# Patient Record
Sex: Female | Born: 1950 | Race: White | Hispanic: No | Marital: Married | Smoking: Never smoker
Health system: Southern US, Community
[De-identification: ages and names within clinical notes are randomized; demographics above are authoritative.]

## PROBLEM LIST (undated history)

## (undated) DIAGNOSIS — K760 Fatty (change of) liver, not elsewhere classified: Secondary | ICD-10-CM

## (undated) DIAGNOSIS — E782 Mixed hyperlipidemia: Secondary | ICD-10-CM

## (undated) DIAGNOSIS — I10 Essential (primary) hypertension: Secondary | ICD-10-CM

## (undated) DIAGNOSIS — D649 Anemia, unspecified: Secondary | ICD-10-CM

## (undated) DIAGNOSIS — L719 Rosacea, unspecified: Secondary | ICD-10-CM

## (undated) DIAGNOSIS — K449 Diaphragmatic hernia without obstruction or gangrene: Secondary | ICD-10-CM

## (undated) DIAGNOSIS — K219 Gastro-esophageal reflux disease without esophagitis: Secondary | ICD-10-CM

## (undated) HISTORY — DX: Fatty (change of) liver, not elsewhere classified: K76.0

## (undated) HISTORY — DX: Anemia, unspecified: D64.9

## (undated) HISTORY — DX: Diaphragmatic hernia without obstruction or gangrene: K44.9

## (undated) HISTORY — PX: CARPAL TUNNEL RELEASE: SHX101

## (undated) HISTORY — DX: Gastro-esophageal reflux disease without esophagitis: K21.9

## (undated) HISTORY — DX: Rosacea, unspecified: L71.9

## (undated) HISTORY — DX: Mixed hyperlipidemia: E78.2

## (undated) HISTORY — DX: Essential (primary) hypertension: I10

---

## 1974-03-14 HISTORY — PX: TUBAL LIGATION: SHX77

## 1998-07-10 ENCOUNTER — Other Ambulatory Visit: Admission: RE | Admit: 1998-07-10 | Discharge: 1998-07-10 | Payer: Self-pay | Admitting: Family Medicine

## 1998-08-05 ENCOUNTER — Encounter: Admission: RE | Admit: 1998-08-05 | Discharge: 1998-08-26 | Payer: Self-pay | Admitting: Orthopedic Surgery

## 2002-09-11 ENCOUNTER — Ambulatory Visit (HOSPITAL_COMMUNITY): Admission: RE | Admit: 2002-09-11 | Discharge: 2002-09-11 | Payer: Self-pay

## 2004-01-01 ENCOUNTER — Other Ambulatory Visit: Admission: RE | Admit: 2004-01-01 | Discharge: 2004-01-01 | Payer: Self-pay | Admitting: Obstetrics & Gynecology

## 2004-09-29 ENCOUNTER — Encounter: Admission: RE | Admit: 2004-09-29 | Discharge: 2004-09-29 | Payer: Self-pay | Admitting: Family Medicine

## 2004-10-13 ENCOUNTER — Encounter: Admission: RE | Admit: 2004-10-13 | Discharge: 2004-10-13 | Payer: Self-pay | Admitting: Family Medicine

## 2004-11-18 ENCOUNTER — Encounter: Admission: RE | Admit: 2004-11-18 | Discharge: 2004-11-18 | Payer: Self-pay | Admitting: Obstetrics & Gynecology

## 2004-11-18 ENCOUNTER — Ambulatory Visit: Payer: Self-pay | Admitting: Internal Medicine

## 2005-01-05 ENCOUNTER — Other Ambulatory Visit: Admission: RE | Admit: 2005-01-05 | Discharge: 2005-01-05 | Payer: Self-pay | Admitting: Obstetrics & Gynecology

## 2006-07-06 ENCOUNTER — Emergency Department (HOSPITAL_COMMUNITY): Admission: EM | Admit: 2006-07-06 | Discharge: 2006-07-06 | Payer: Self-pay | Admitting: Physician Assistant

## 2013-03-14 HISTORY — PX: CATARACT EXTRACTION: SUR2

## 2013-03-14 HISTORY — PX: SHOULDER SURGERY: SHX246

## 2014-05-05 DIAGNOSIS — R6882 Decreased libido: Secondary | ICD-10-CM | POA: Diagnosis not present

## 2014-06-23 DIAGNOSIS — E782 Mixed hyperlipidemia: Secondary | ICD-10-CM | POA: Diagnosis not present

## 2014-06-23 DIAGNOSIS — E1165 Type 2 diabetes mellitus with hyperglycemia: Secondary | ICD-10-CM | POA: Diagnosis not present

## 2014-06-27 DIAGNOSIS — Z23 Encounter for immunization: Secondary | ICD-10-CM | POA: Diagnosis not present

## 2014-06-27 DIAGNOSIS — Z9181 History of falling: Secondary | ICD-10-CM | POA: Diagnosis not present

## 2014-06-27 DIAGNOSIS — Z1389 Encounter for screening for other disorder: Secondary | ICD-10-CM | POA: Diagnosis not present

## 2014-06-27 DIAGNOSIS — E1165 Type 2 diabetes mellitus with hyperglycemia: Secondary | ICD-10-CM | POA: Diagnosis not present

## 2014-06-27 DIAGNOSIS — Z6832 Body mass index (BMI) 32.0-32.9, adult: Secondary | ICD-10-CM | POA: Diagnosis not present

## 2014-06-27 DIAGNOSIS — E782 Mixed hyperlipidemia: Secondary | ICD-10-CM | POA: Diagnosis not present

## 2014-06-27 DIAGNOSIS — I10 Essential (primary) hypertension: Secondary | ICD-10-CM | POA: Diagnosis not present

## 2014-07-04 DIAGNOSIS — R6882 Decreased libido: Secondary | ICD-10-CM | POA: Diagnosis not present

## 2014-07-15 DIAGNOSIS — Z1289 Encounter for screening for malignant neoplasm of other sites: Secondary | ICD-10-CM | POA: Diagnosis not present

## 2014-07-15 DIAGNOSIS — Z1231 Encounter for screening mammogram for malignant neoplasm of breast: Secondary | ICD-10-CM | POA: Diagnosis not present

## 2014-07-28 DIAGNOSIS — R748 Abnormal levels of other serum enzymes: Secondary | ICD-10-CM | POA: Diagnosis not present

## 2014-09-03 DIAGNOSIS — R6882 Decreased libido: Secondary | ICD-10-CM | POA: Diagnosis not present

## 2014-10-28 DIAGNOSIS — E1165 Type 2 diabetes mellitus with hyperglycemia: Secondary | ICD-10-CM | POA: Diagnosis not present

## 2014-10-28 DIAGNOSIS — E782 Mixed hyperlipidemia: Secondary | ICD-10-CM | POA: Diagnosis not present

## 2014-10-28 DIAGNOSIS — Z79899 Other long term (current) drug therapy: Secondary | ICD-10-CM | POA: Diagnosis not present

## 2014-10-31 DIAGNOSIS — Z6831 Body mass index (BMI) 31.0-31.9, adult: Secondary | ICD-10-CM | POA: Diagnosis not present

## 2014-10-31 DIAGNOSIS — R6882 Decreased libido: Secondary | ICD-10-CM | POA: Diagnosis not present

## 2014-10-31 DIAGNOSIS — Z1389 Encounter for screening for other disorder: Secondary | ICD-10-CM | POA: Diagnosis not present

## 2014-10-31 DIAGNOSIS — Z9181 History of falling: Secondary | ICD-10-CM | POA: Diagnosis not present

## 2014-10-31 DIAGNOSIS — E1165 Type 2 diabetes mellitus with hyperglycemia: Secondary | ICD-10-CM | POA: Diagnosis not present

## 2014-10-31 DIAGNOSIS — I10 Essential (primary) hypertension: Secondary | ICD-10-CM | POA: Diagnosis not present

## 2014-10-31 DIAGNOSIS — E782 Mixed hyperlipidemia: Secondary | ICD-10-CM | POA: Diagnosis not present

## 2014-10-31 DIAGNOSIS — R748 Abnormal levels of other serum enzymes: Secondary | ICD-10-CM | POA: Diagnosis not present

## 2014-12-02 DIAGNOSIS — Z23 Encounter for immunization: Secondary | ICD-10-CM | POA: Diagnosis not present

## 2014-12-11 DIAGNOSIS — K045 Chronic apical periodontitis: Secondary | ICD-10-CM | POA: Diagnosis not present

## 2015-01-02 DIAGNOSIS — R6882 Decreased libido: Secondary | ICD-10-CM | POA: Diagnosis not present

## 2015-02-10 DIAGNOSIS — H2703 Aphakia, bilateral: Secondary | ICD-10-CM | POA: Diagnosis not present

## 2015-02-10 DIAGNOSIS — E119 Type 2 diabetes mellitus without complications: Secondary | ICD-10-CM | POA: Diagnosis not present

## 2015-02-27 DIAGNOSIS — E1165 Type 2 diabetes mellitus with hyperglycemia: Secondary | ICD-10-CM | POA: Diagnosis not present

## 2015-02-27 DIAGNOSIS — E782 Mixed hyperlipidemia: Secondary | ICD-10-CM | POA: Diagnosis not present

## 2015-03-03 DIAGNOSIS — R6882 Decreased libido: Secondary | ICD-10-CM | POA: Diagnosis not present

## 2015-03-03 DIAGNOSIS — E782 Mixed hyperlipidemia: Secondary | ICD-10-CM | POA: Diagnosis not present

## 2015-03-03 DIAGNOSIS — I10 Essential (primary) hypertension: Secondary | ICD-10-CM | POA: Diagnosis not present

## 2015-03-03 DIAGNOSIS — E663 Overweight: Secondary | ICD-10-CM | POA: Diagnosis not present

## 2015-03-03 DIAGNOSIS — E1165 Type 2 diabetes mellitus with hyperglycemia: Secondary | ICD-10-CM | POA: Diagnosis not present

## 2015-03-03 DIAGNOSIS — Z6831 Body mass index (BMI) 31.0-31.9, adult: Secondary | ICD-10-CM | POA: Diagnosis not present

## 2015-05-05 DIAGNOSIS — Z6830 Body mass index (BMI) 30.0-30.9, adult: Secondary | ICD-10-CM | POA: Diagnosis not present

## 2015-05-05 DIAGNOSIS — R6882 Decreased libido: Secondary | ICD-10-CM | POA: Diagnosis not present

## 2015-05-05 DIAGNOSIS — M65842 Other synovitis and tenosynovitis, left hand: Secondary | ICD-10-CM | POA: Diagnosis not present

## 2015-06-18 DIAGNOSIS — M654 Radial styloid tenosynovitis [de Quervain]: Secondary | ICD-10-CM | POA: Diagnosis not present

## 2015-07-02 DIAGNOSIS — R6882 Decreased libido: Secondary | ICD-10-CM | POA: Diagnosis not present

## 2015-07-02 DIAGNOSIS — E782 Mixed hyperlipidemia: Secondary | ICD-10-CM | POA: Diagnosis not present

## 2015-07-02 DIAGNOSIS — E1165 Type 2 diabetes mellitus with hyperglycemia: Secondary | ICD-10-CM | POA: Diagnosis not present

## 2015-07-06 DIAGNOSIS — Z6831 Body mass index (BMI) 31.0-31.9, adult: Secondary | ICD-10-CM | POA: Diagnosis not present

## 2015-07-06 DIAGNOSIS — E782 Mixed hyperlipidemia: Secondary | ICD-10-CM | POA: Diagnosis not present

## 2015-07-06 DIAGNOSIS — E1165 Type 2 diabetes mellitus with hyperglycemia: Secondary | ICD-10-CM | POA: Diagnosis not present

## 2015-07-06 DIAGNOSIS — K219 Gastro-esophageal reflux disease without esophagitis: Secondary | ICD-10-CM | POA: Diagnosis not present

## 2015-07-06 DIAGNOSIS — Z23 Encounter for immunization: Secondary | ICD-10-CM | POA: Diagnosis not present

## 2015-07-06 DIAGNOSIS — R6882 Decreased libido: Secondary | ICD-10-CM | POA: Diagnosis not present

## 2015-07-06 DIAGNOSIS — I10 Essential (primary) hypertension: Secondary | ICD-10-CM | POA: Diagnosis not present

## 2015-08-03 DIAGNOSIS — M654 Radial styloid tenosynovitis [de Quervain]: Secondary | ICD-10-CM | POA: Diagnosis not present

## 2015-09-02 DIAGNOSIS — R6882 Decreased libido: Secondary | ICD-10-CM | POA: Diagnosis not present

## 2015-09-08 DIAGNOSIS — Z1231 Encounter for screening mammogram for malignant neoplasm of breast: Secondary | ICD-10-CM | POA: Diagnosis not present

## 2015-09-08 DIAGNOSIS — Z124 Encounter for screening for malignant neoplasm of cervix: Secondary | ICD-10-CM | POA: Diagnosis not present

## 2015-09-14 ENCOUNTER — Other Ambulatory Visit: Payer: Self-pay | Admitting: Obstetrics & Gynecology

## 2015-09-14 DIAGNOSIS — R928 Other abnormal and inconclusive findings on diagnostic imaging of breast: Secondary | ICD-10-CM

## 2015-09-22 ENCOUNTER — Other Ambulatory Visit: Payer: Self-pay | Admitting: Obstetrics & Gynecology

## 2015-09-22 DIAGNOSIS — E2839 Other primary ovarian failure: Secondary | ICD-10-CM

## 2015-09-30 ENCOUNTER — Ambulatory Visit
Admission: RE | Admit: 2015-09-30 | Discharge: 2015-09-30 | Disposition: A | Discharge status: Home / Self Care | Payer: Medicare Other | Source: Ambulatory Visit | Attending: Obstetrics & Gynecology | Admitting: Obstetrics & Gynecology

## 2015-09-30 DIAGNOSIS — R928 Other abnormal and inconclusive findings on diagnostic imaging of breast: Secondary | ICD-10-CM

## 2015-09-30 DIAGNOSIS — R921 Mammographic calcification found on diagnostic imaging of breast: Secondary | ICD-10-CM | POA: Diagnosis not present

## 2015-10-30 DIAGNOSIS — E782 Mixed hyperlipidemia: Secondary | ICD-10-CM | POA: Diagnosis not present

## 2015-10-30 DIAGNOSIS — E1165 Type 2 diabetes mellitus with hyperglycemia: Secondary | ICD-10-CM | POA: Diagnosis not present

## 2015-10-30 DIAGNOSIS — Z79899 Other long term (current) drug therapy: Secondary | ICD-10-CM | POA: Diagnosis not present

## 2015-11-03 DIAGNOSIS — Z6831 Body mass index (BMI) 31.0-31.9, adult: Secondary | ICD-10-CM | POA: Diagnosis not present

## 2015-11-03 DIAGNOSIS — I10 Essential (primary) hypertension: Secondary | ICD-10-CM | POA: Diagnosis not present

## 2015-11-03 DIAGNOSIS — Z1389 Encounter for screening for other disorder: Secondary | ICD-10-CM | POA: Diagnosis not present

## 2015-11-03 DIAGNOSIS — K219 Gastro-esophageal reflux disease without esophagitis: Secondary | ICD-10-CM | POA: Diagnosis not present

## 2015-11-03 DIAGNOSIS — R6882 Decreased libido: Secondary | ICD-10-CM | POA: Diagnosis not present

## 2015-11-03 DIAGNOSIS — E1165 Type 2 diabetes mellitus with hyperglycemia: Secondary | ICD-10-CM | POA: Diagnosis not present

## 2015-11-03 DIAGNOSIS — D649 Anemia, unspecified: Secondary | ICD-10-CM | POA: Diagnosis not present

## 2015-11-03 DIAGNOSIS — Z9181 History of falling: Secondary | ICD-10-CM | POA: Diagnosis not present

## 2015-11-03 DIAGNOSIS — E782 Mixed hyperlipidemia: Secondary | ICD-10-CM | POA: Diagnosis not present

## 2015-11-15 DIAGNOSIS — R1084 Generalized abdominal pain: Secondary | ICD-10-CM | POA: Diagnosis not present

## 2015-11-15 DIAGNOSIS — R300 Dysuria: Secondary | ICD-10-CM | POA: Diagnosis not present

## 2015-11-15 DIAGNOSIS — N3090 Cystitis, unspecified without hematuria: Secondary | ICD-10-CM | POA: Diagnosis not present

## 2015-12-17 DIAGNOSIS — N201 Calculus of ureter: Secondary | ICD-10-CM | POA: Diagnosis not present

## 2015-12-17 DIAGNOSIS — N390 Urinary tract infection, site not specified: Secondary | ICD-10-CM | POA: Diagnosis not present

## 2015-12-17 DIAGNOSIS — N132 Hydronephrosis with renal and ureteral calculous obstruction: Secondary | ICD-10-CM | POA: Diagnosis not present

## 2015-12-31 DIAGNOSIS — R6882 Decreased libido: Secondary | ICD-10-CM | POA: Diagnosis not present

## 2016-01-04 DIAGNOSIS — N200 Calculus of kidney: Secondary | ICD-10-CM | POA: Diagnosis not present

## 2016-01-04 DIAGNOSIS — R310 Gross hematuria: Secondary | ICD-10-CM | POA: Diagnosis not present

## 2016-01-04 DIAGNOSIS — Z23 Encounter for immunization: Secondary | ICD-10-CM | POA: Diagnosis not present

## 2016-01-12 DIAGNOSIS — N200 Calculus of kidney: Secondary | ICD-10-CM | POA: Diagnosis not present

## 2016-01-12 DIAGNOSIS — R310 Gross hematuria: Secondary | ICD-10-CM | POA: Diagnosis not present

## 2016-01-15 DIAGNOSIS — Z79899 Other long term (current) drug therapy: Secondary | ICD-10-CM | POA: Diagnosis not present

## 2016-01-15 DIAGNOSIS — I10 Essential (primary) hypertension: Secondary | ICD-10-CM | POA: Diagnosis not present

## 2016-01-15 DIAGNOSIS — Z7982 Long term (current) use of aspirin: Secondary | ICD-10-CM | POA: Diagnosis not present

## 2016-01-15 DIAGNOSIS — E119 Type 2 diabetes mellitus without complications: Secondary | ICD-10-CM | POA: Diagnosis not present

## 2016-01-15 DIAGNOSIS — J45909 Unspecified asthma, uncomplicated: Secondary | ICD-10-CM | POA: Diagnosis not present

## 2016-01-15 DIAGNOSIS — Z7984 Long term (current) use of oral hypoglycemic drugs: Secondary | ICD-10-CM | POA: Diagnosis not present

## 2016-01-15 DIAGNOSIS — K449 Diaphragmatic hernia without obstruction or gangrene: Secondary | ICD-10-CM | POA: Diagnosis not present

## 2016-01-15 DIAGNOSIS — N200 Calculus of kidney: Secondary | ICD-10-CM | POA: Diagnosis not present

## 2016-01-22 DIAGNOSIS — R109 Unspecified abdominal pain: Secondary | ICD-10-CM | POA: Diagnosis not present

## 2016-01-22 DIAGNOSIS — N200 Calculus of kidney: Secondary | ICD-10-CM | POA: Diagnosis not present

## 2016-02-17 DIAGNOSIS — H2703 Aphakia, bilateral: Secondary | ICD-10-CM | POA: Diagnosis not present

## 2016-02-17 DIAGNOSIS — E119 Type 2 diabetes mellitus without complications: Secondary | ICD-10-CM | POA: Diagnosis not present

## 2016-02-25 DIAGNOSIS — E1165 Type 2 diabetes mellitus with hyperglycemia: Secondary | ICD-10-CM | POA: Diagnosis not present

## 2016-02-25 DIAGNOSIS — E782 Mixed hyperlipidemia: Secondary | ICD-10-CM | POA: Diagnosis not present

## 2016-02-25 DIAGNOSIS — D649 Anemia, unspecified: Secondary | ICD-10-CM | POA: Diagnosis not present

## 2016-02-25 DIAGNOSIS — I10 Essential (primary) hypertension: Secondary | ICD-10-CM | POA: Diagnosis not present

## 2016-03-04 DIAGNOSIS — I10 Essential (primary) hypertension: Secondary | ICD-10-CM | POA: Diagnosis not present

## 2016-03-04 DIAGNOSIS — E669 Obesity, unspecified: Secondary | ICD-10-CM | POA: Diagnosis not present

## 2016-03-04 DIAGNOSIS — E1165 Type 2 diabetes mellitus with hyperglycemia: Secondary | ICD-10-CM | POA: Diagnosis not present

## 2016-03-04 DIAGNOSIS — E782 Mixed hyperlipidemia: Secondary | ICD-10-CM | POA: Diagnosis not present

## 2016-03-04 DIAGNOSIS — R748 Abnormal levels of other serum enzymes: Secondary | ICD-10-CM | POA: Diagnosis not present

## 2016-03-04 DIAGNOSIS — J45909 Unspecified asthma, uncomplicated: Secondary | ICD-10-CM | POA: Diagnosis not present

## 2016-03-04 DIAGNOSIS — Z6831 Body mass index (BMI) 31.0-31.9, adult: Secondary | ICD-10-CM | POA: Diagnosis not present

## 2016-03-04 DIAGNOSIS — R6882 Decreased libido: Secondary | ICD-10-CM | POA: Diagnosis not present

## 2016-03-16 DIAGNOSIS — N200 Calculus of kidney: Secondary | ICD-10-CM | POA: Diagnosis not present

## 2016-03-16 DIAGNOSIS — R109 Unspecified abdominal pain: Secondary | ICD-10-CM | POA: Diagnosis not present

## 2016-03-22 DIAGNOSIS — E1165 Type 2 diabetes mellitus with hyperglycemia: Secondary | ICD-10-CM | POA: Diagnosis not present

## 2016-03-22 DIAGNOSIS — J069 Acute upper respiratory infection, unspecified: Secondary | ICD-10-CM | POA: Diagnosis not present

## 2016-03-22 DIAGNOSIS — J209 Acute bronchitis, unspecified: Secondary | ICD-10-CM | POA: Diagnosis not present

## 2016-05-03 DIAGNOSIS — R6882 Decreased libido: Secondary | ICD-10-CM | POA: Diagnosis not present

## 2016-07-04 DIAGNOSIS — E782 Mixed hyperlipidemia: Secondary | ICD-10-CM | POA: Diagnosis not present

## 2016-07-04 DIAGNOSIS — E1165 Type 2 diabetes mellitus with hyperglycemia: Secondary | ICD-10-CM | POA: Diagnosis not present

## 2016-07-06 DIAGNOSIS — I10 Essential (primary) hypertension: Secondary | ICD-10-CM | POA: Diagnosis not present

## 2016-07-06 DIAGNOSIS — E669 Obesity, unspecified: Secondary | ICD-10-CM | POA: Diagnosis not present

## 2016-07-06 DIAGNOSIS — J45909 Unspecified asthma, uncomplicated: Secondary | ICD-10-CM | POA: Diagnosis not present

## 2016-07-06 DIAGNOSIS — K219 Gastro-esophageal reflux disease without esophagitis: Secondary | ICD-10-CM | POA: Diagnosis not present

## 2016-07-06 DIAGNOSIS — L719 Rosacea, unspecified: Secondary | ICD-10-CM | POA: Diagnosis not present

## 2016-07-06 DIAGNOSIS — Z6832 Body mass index (BMI) 32.0-32.9, adult: Secondary | ICD-10-CM | POA: Diagnosis not present

## 2016-07-06 DIAGNOSIS — E1165 Type 2 diabetes mellitus with hyperglycemia: Secondary | ICD-10-CM | POA: Diagnosis not present

## 2016-07-06 DIAGNOSIS — R748 Abnormal levels of other serum enzymes: Secondary | ICD-10-CM | POA: Diagnosis not present

## 2016-08-03 DIAGNOSIS — M1711 Unilateral primary osteoarthritis, right knee: Secondary | ICD-10-CM | POA: Diagnosis not present

## 2016-08-05 DIAGNOSIS — R748 Abnormal levels of other serum enzymes: Secondary | ICD-10-CM | POA: Diagnosis not present

## 2016-08-29 DIAGNOSIS — M1712 Unilateral primary osteoarthritis, left knee: Secondary | ICD-10-CM | POA: Diagnosis not present

## 2016-08-31 DIAGNOSIS — R6882 Decreased libido: Secondary | ICD-10-CM | POA: Diagnosis not present

## 2016-09-06 DIAGNOSIS — M4316 Spondylolisthesis, lumbar region: Secondary | ICD-10-CM | POA: Diagnosis not present

## 2016-09-12 DIAGNOSIS — R1031 Right lower quadrant pain: Secondary | ICD-10-CM | POA: Diagnosis not present

## 2016-09-12 DIAGNOSIS — N200 Calculus of kidney: Secondary | ICD-10-CM | POA: Diagnosis not present

## 2016-09-12 DIAGNOSIS — N201 Calculus of ureter: Secondary | ICD-10-CM | POA: Diagnosis not present

## 2016-10-10 DIAGNOSIS — M1711 Unilateral primary osteoarthritis, right knee: Secondary | ICD-10-CM | POA: Diagnosis not present

## 2016-10-18 DIAGNOSIS — M4316 Spondylolisthesis, lumbar region: Secondary | ICD-10-CM | POA: Diagnosis not present

## 2016-10-24 DIAGNOSIS — M545 Low back pain: Secondary | ICD-10-CM | POA: Diagnosis not present

## 2016-10-24 DIAGNOSIS — M48061 Spinal stenosis, lumbar region without neurogenic claudication: Secondary | ICD-10-CM | POA: Diagnosis not present

## 2016-10-24 DIAGNOSIS — M4316 Spondylolisthesis, lumbar region: Secondary | ICD-10-CM | POA: Diagnosis not present

## 2016-10-26 DIAGNOSIS — M545 Low back pain: Secondary | ICD-10-CM | POA: Diagnosis not present

## 2016-10-31 ENCOUNTER — Other Ambulatory Visit: Payer: Self-pay | Admitting: Obstetrics & Gynecology

## 2016-10-31 DIAGNOSIS — Z1231 Encounter for screening mammogram for malignant neoplasm of breast: Secondary | ICD-10-CM

## 2016-10-31 DIAGNOSIS — E2839 Other primary ovarian failure: Secondary | ICD-10-CM

## 2016-11-01 DIAGNOSIS — M545 Low back pain: Secondary | ICD-10-CM | POA: Diagnosis not present

## 2016-11-03 DIAGNOSIS — E782 Mixed hyperlipidemia: Secondary | ICD-10-CM | POA: Diagnosis not present

## 2016-11-03 DIAGNOSIS — Z79899 Other long term (current) drug therapy: Secondary | ICD-10-CM | POA: Diagnosis not present

## 2016-11-03 DIAGNOSIS — E1165 Type 2 diabetes mellitus with hyperglycemia: Secondary | ICD-10-CM | POA: Diagnosis not present

## 2016-11-03 DIAGNOSIS — R6882 Decreased libido: Secondary | ICD-10-CM | POA: Diagnosis not present

## 2016-11-07 DIAGNOSIS — Z1389 Encounter for screening for other disorder: Secondary | ICD-10-CM | POA: Diagnosis not present

## 2016-11-07 DIAGNOSIS — I10 Essential (primary) hypertension: Secondary | ICD-10-CM | POA: Diagnosis not present

## 2016-11-07 DIAGNOSIS — E1165 Type 2 diabetes mellitus with hyperglycemia: Secondary | ICD-10-CM | POA: Diagnosis not present

## 2016-11-07 DIAGNOSIS — Z9181 History of falling: Secondary | ICD-10-CM | POA: Diagnosis not present

## 2016-11-07 DIAGNOSIS — E782 Mixed hyperlipidemia: Secondary | ICD-10-CM | POA: Diagnosis not present

## 2016-11-07 DIAGNOSIS — Z6832 Body mass index (BMI) 32.0-32.9, adult: Secondary | ICD-10-CM | POA: Diagnosis not present

## 2016-11-07 DIAGNOSIS — K219 Gastro-esophageal reflux disease without esophagitis: Secondary | ICD-10-CM | POA: Diagnosis not present

## 2016-11-07 DIAGNOSIS — J45909 Unspecified asthma, uncomplicated: Secondary | ICD-10-CM | POA: Diagnosis not present

## 2016-11-09 ENCOUNTER — Ambulatory Visit: Payer: Medicare Other

## 2016-11-11 DIAGNOSIS — Z1389 Encounter for screening for other disorder: Secondary | ICD-10-CM | POA: Diagnosis not present

## 2016-11-11 DIAGNOSIS — Z23 Encounter for immunization: Secondary | ICD-10-CM | POA: Diagnosis not present

## 2016-11-11 DIAGNOSIS — Z6832 Body mass index (BMI) 32.0-32.9, adult: Secondary | ICD-10-CM | POA: Diagnosis not present

## 2016-11-11 DIAGNOSIS — E669 Obesity, unspecified: Secondary | ICD-10-CM | POA: Diagnosis not present

## 2016-11-11 DIAGNOSIS — Z9181 History of falling: Secondary | ICD-10-CM | POA: Diagnosis not present

## 2016-11-11 DIAGNOSIS — Z136 Encounter for screening for cardiovascular disorders: Secondary | ICD-10-CM | POA: Diagnosis not present

## 2016-11-11 DIAGNOSIS — Z0000 Encounter for general adult medical examination without abnormal findings: Secondary | ICD-10-CM | POA: Diagnosis not present

## 2016-11-11 DIAGNOSIS — E785 Hyperlipidemia, unspecified: Secondary | ICD-10-CM | POA: Diagnosis not present

## 2016-11-15 ENCOUNTER — Ambulatory Visit
Admission: RE | Admit: 2016-11-15 | Discharge: 2016-11-15 | Disposition: A | Discharge status: Home / Self Care | Payer: Medicare Other | Source: Ambulatory Visit | Attending: Obstetrics & Gynecology | Admitting: Obstetrics & Gynecology

## 2016-11-15 DIAGNOSIS — M8589 Other specified disorders of bone density and structure, multiple sites: Secondary | ICD-10-CM | POA: Diagnosis not present

## 2016-11-15 DIAGNOSIS — Z1231 Encounter for screening mammogram for malignant neoplasm of breast: Secondary | ICD-10-CM

## 2016-11-15 DIAGNOSIS — Z780 Asymptomatic menopausal state: Secondary | ICD-10-CM | POA: Diagnosis not present

## 2016-11-15 DIAGNOSIS — E2839 Other primary ovarian failure: Secondary | ICD-10-CM

## 2016-12-13 DIAGNOSIS — M545 Low back pain: Secondary | ICD-10-CM | POA: Diagnosis not present

## 2016-12-13 DIAGNOSIS — M4316 Spondylolisthesis, lumbar region: Secondary | ICD-10-CM | POA: Diagnosis not present

## 2017-01-02 DIAGNOSIS — M1712 Unilateral primary osteoarthritis, left knee: Secondary | ICD-10-CM | POA: Diagnosis not present

## 2017-01-02 DIAGNOSIS — R6882 Decreased libido: Secondary | ICD-10-CM | POA: Diagnosis not present

## 2017-01-02 DIAGNOSIS — Z23 Encounter for immunization: Secondary | ICD-10-CM | POA: Diagnosis not present

## 2017-01-10 DIAGNOSIS — E119 Type 2 diabetes mellitus without complications: Secondary | ICD-10-CM | POA: Diagnosis not present

## 2017-01-10 DIAGNOSIS — H2703 Aphakia, bilateral: Secondary | ICD-10-CM | POA: Diagnosis not present

## 2017-01-24 DIAGNOSIS — M545 Low back pain: Secondary | ICD-10-CM | POA: Diagnosis not present

## 2017-01-24 IMAGING — MG DIGITAL DIAGNOSTIC UNILATERAL LEFT MAMMOGRAM
5 series · 6 of 9 positions shown · non-contrast
Comparison: Previous exam(s).

CLINICAL DATA: Patient was called back from screening mammogram for
left breast calcifications.

EXAM:
2D DIGITAL DIAGNOSTIC UNILATERAL LEFT MAMMOGRAM WITH CAD AND ADJUNCT
TOMO

[L CC]
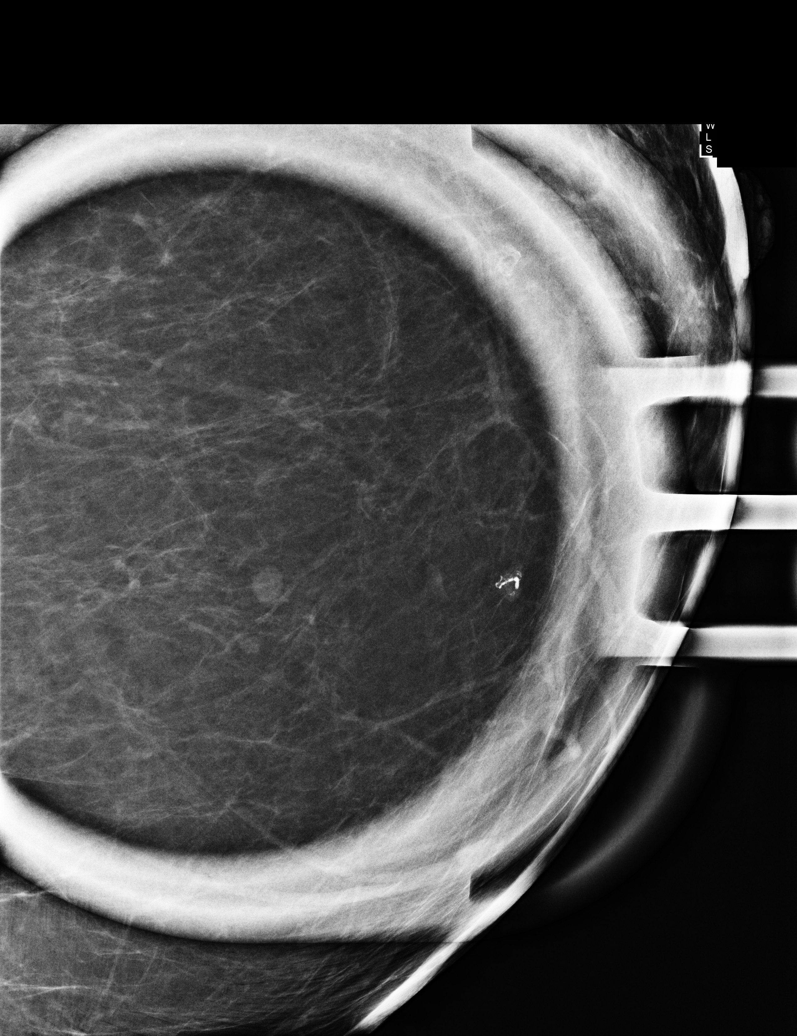

[L ML (1 of 2)]
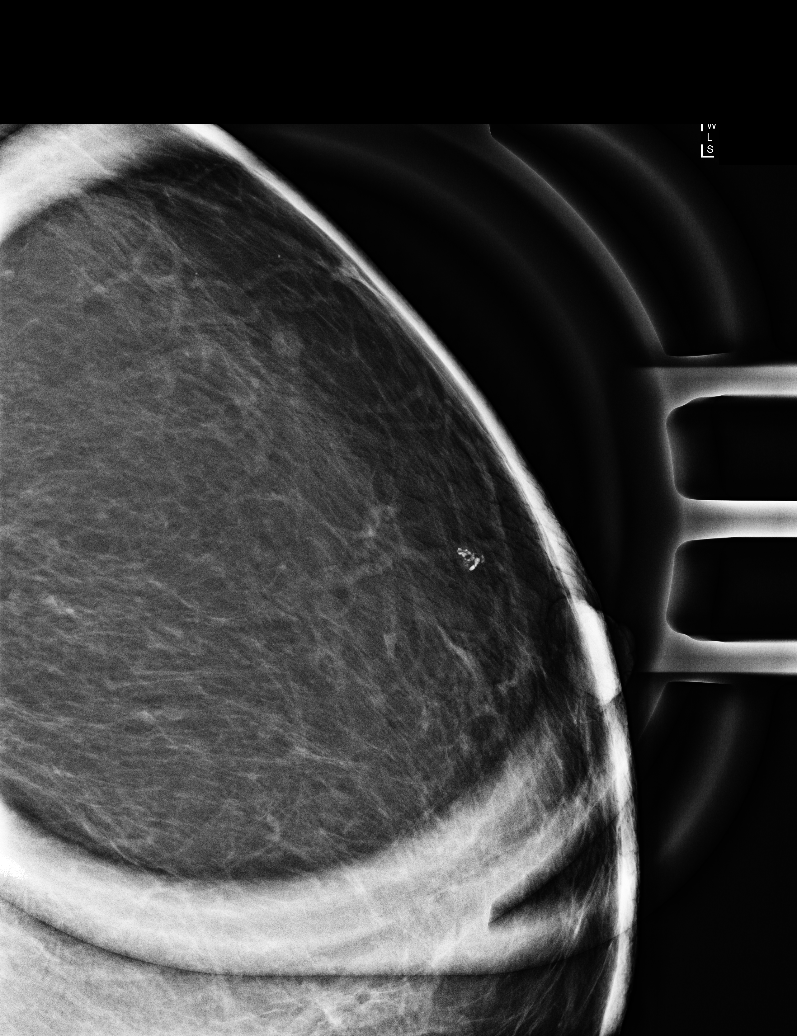

[L ML (2 of 2)]
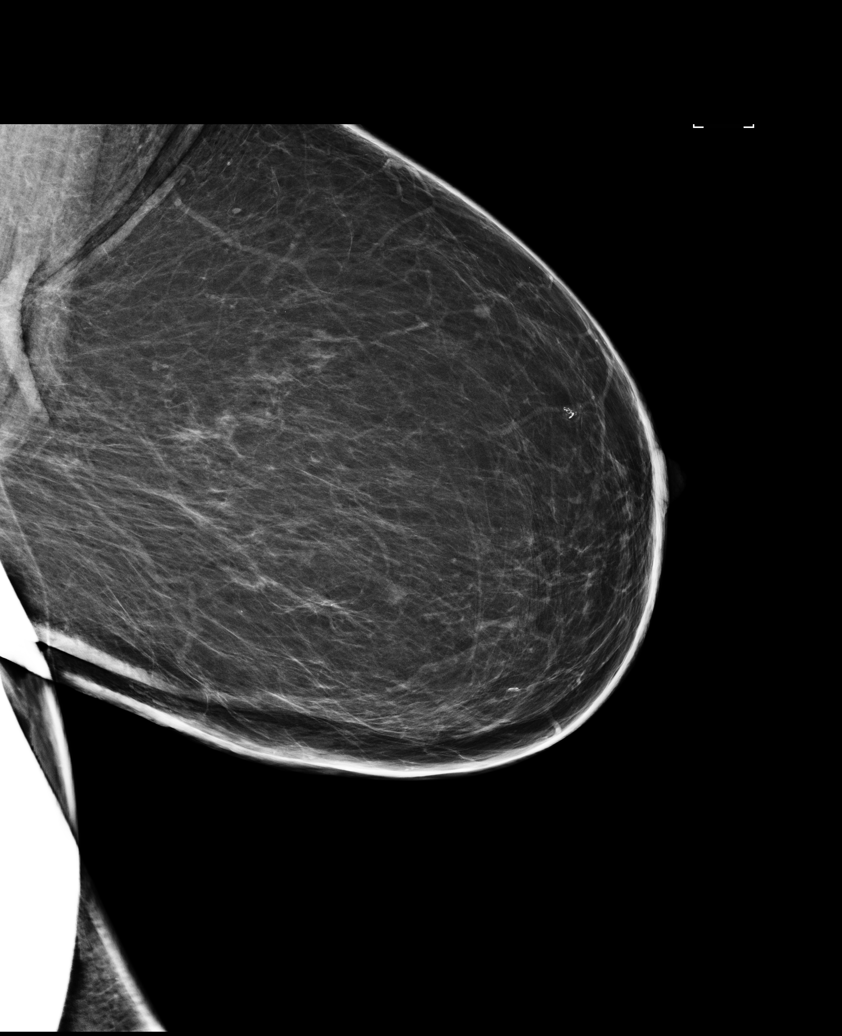

[L ML synth-2D]
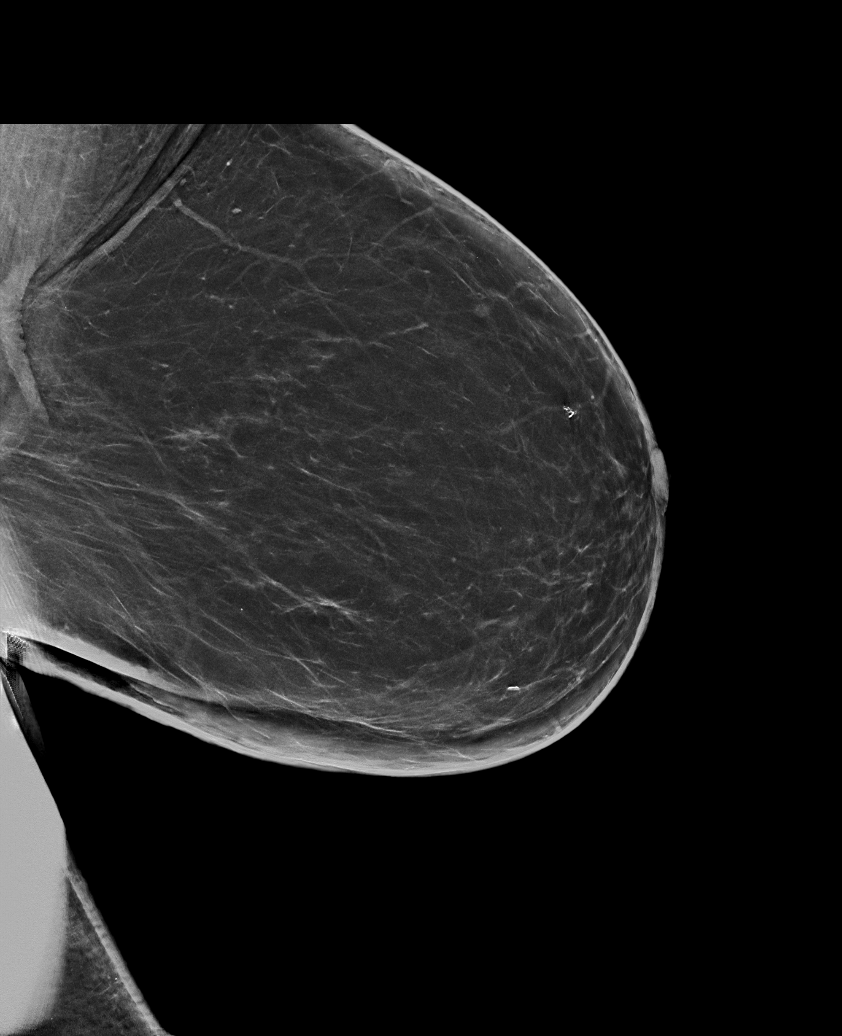

[L ML tomo · 2 of 81 frames shown]
[frame 27/81]
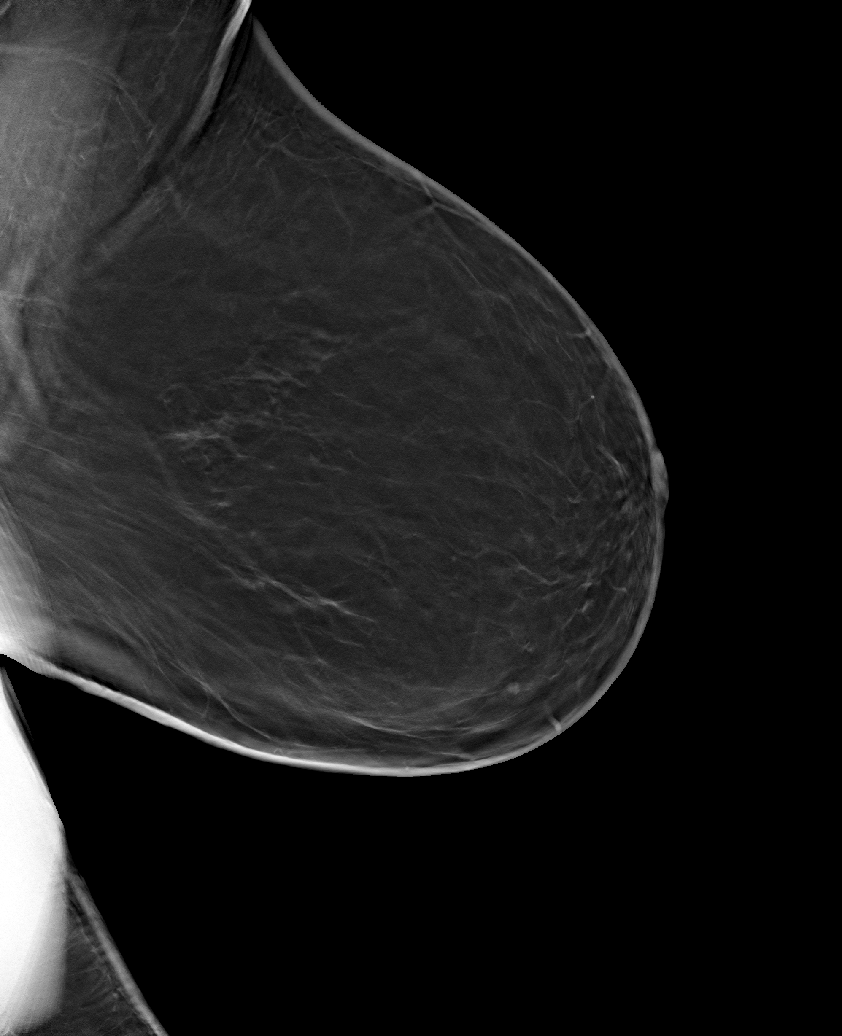
[frame 41/81]
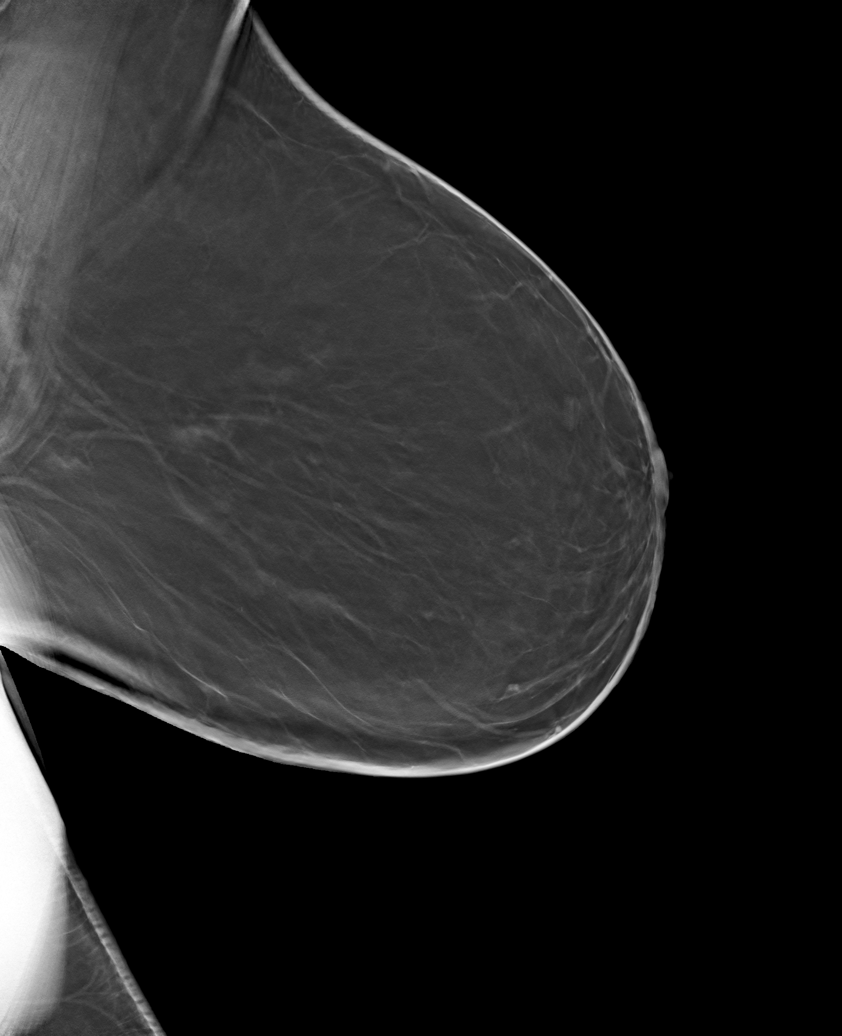

[6 of 9 positions shown; findings below may reference images not displayed]

ACR Breast Density Category b: There are scattered areas of
fibroglandular density.
FINDINGS: Additional imaging of the left breast was performed. Tightly grouped
3 mm coarse dystrophic appearing calcifications are seen in the
anterior aspect of the upper inner quadrant. No suspicious mass or
malignant type microcalcifications seen.

Mammographic images were processed with CAD.
IMPRESSION: No evidence of malignancy in the left breast.

RECOMMENDATION:
Bilateral screening mammogram in Friday August, 2016 is recommended.

I have discussed the findings and recommendations with the patient.
Results were also provided in writing at the conclusion of the
visit. If applicable, a reminder letter will be sent to the patient
regarding the next appointment.

BI-RADS CATEGORY  2: Benign.

## 2017-02-27 DIAGNOSIS — M1711 Unilateral primary osteoarthritis, right knee: Secondary | ICD-10-CM | POA: Diagnosis not present

## 2017-03-03 DIAGNOSIS — R6882 Decreased libido: Secondary | ICD-10-CM | POA: Diagnosis not present

## 2017-03-03 DIAGNOSIS — M1712 Unilateral primary osteoarthritis, left knee: Secondary | ICD-10-CM | POA: Diagnosis not present

## 2017-03-13 DIAGNOSIS — Z6832 Body mass index (BMI) 32.0-32.9, adult: Secondary | ICD-10-CM | POA: Diagnosis not present

## 2017-03-13 DIAGNOSIS — J45909 Unspecified asthma, uncomplicated: Secondary | ICD-10-CM | POA: Diagnosis not present

## 2017-03-13 DIAGNOSIS — R6882 Decreased libido: Secondary | ICD-10-CM | POA: Diagnosis not present

## 2017-03-13 DIAGNOSIS — E119 Type 2 diabetes mellitus without complications: Secondary | ICD-10-CM | POA: Diagnosis not present

## 2017-03-13 DIAGNOSIS — K219 Gastro-esophageal reflux disease without esophagitis: Secondary | ICD-10-CM | POA: Diagnosis not present

## 2017-03-13 DIAGNOSIS — R748 Abnormal levels of other serum enzymes: Secondary | ICD-10-CM | POA: Diagnosis not present

## 2017-03-13 DIAGNOSIS — I10 Essential (primary) hypertension: Secondary | ICD-10-CM | POA: Diagnosis not present

## 2017-03-15 DIAGNOSIS — M545 Low back pain: Secondary | ICD-10-CM | POA: Diagnosis not present

## 2017-05-02 DIAGNOSIS — R6882 Decreased libido: Secondary | ICD-10-CM | POA: Diagnosis not present

## 2017-06-23 ENCOUNTER — Encounter: Payer: Self-pay | Admitting: Gastroenterology

## 2017-07-03 DIAGNOSIS — R6882 Decreased libido: Secondary | ICD-10-CM | POA: Diagnosis not present

## 2017-07-11 DIAGNOSIS — E1165 Type 2 diabetes mellitus with hyperglycemia: Secondary | ICD-10-CM | POA: Diagnosis not present

## 2017-07-11 DIAGNOSIS — E782 Mixed hyperlipidemia: Secondary | ICD-10-CM | POA: Diagnosis not present

## 2017-07-11 DIAGNOSIS — R748 Abnormal levels of other serum enzymes: Secondary | ICD-10-CM | POA: Diagnosis not present

## 2017-07-13 DIAGNOSIS — L719 Rosacea, unspecified: Secondary | ICD-10-CM | POA: Diagnosis not present

## 2017-07-13 DIAGNOSIS — I10 Essential (primary) hypertension: Secondary | ICD-10-CM | POA: Diagnosis not present

## 2017-07-13 DIAGNOSIS — E1129 Type 2 diabetes mellitus with other diabetic kidney complication: Secondary | ICD-10-CM | POA: Diagnosis not present

## 2017-07-13 DIAGNOSIS — K219 Gastro-esophageal reflux disease without esophagitis: Secondary | ICD-10-CM | POA: Diagnosis not present

## 2017-07-31 DIAGNOSIS — R748 Abnormal levels of other serum enzymes: Secondary | ICD-10-CM | POA: Diagnosis not present

## 2017-07-31 DIAGNOSIS — K760 Fatty (change of) liver, not elsewhere classified: Secondary | ICD-10-CM | POA: Diagnosis not present

## 2017-07-31 DIAGNOSIS — R740 Nonspecific elevation of levels of transaminase and lactic acid dehydrogenase [LDH]: Secondary | ICD-10-CM | POA: Diagnosis not present

## 2017-08-24 DIAGNOSIS — J4521 Mild intermittent asthma with (acute) exacerbation: Secondary | ICD-10-CM | POA: Diagnosis not present

## 2017-08-24 DIAGNOSIS — Z6831 Body mass index (BMI) 31.0-31.9, adult: Secondary | ICD-10-CM | POA: Diagnosis not present

## 2017-09-11 DIAGNOSIS — R1031 Right lower quadrant pain: Secondary | ICD-10-CM | POA: Diagnosis not present

## 2017-09-11 DIAGNOSIS — N132 Hydronephrosis with renal and ureteral calculous obstruction: Secondary | ICD-10-CM | POA: Diagnosis not present

## 2017-09-11 DIAGNOSIS — N200 Calculus of kidney: Secondary | ICD-10-CM | POA: Diagnosis not present

## 2017-09-11 DIAGNOSIS — N1330 Unspecified hydronephrosis: Secondary | ICD-10-CM | POA: Diagnosis not present

## 2017-09-20 DIAGNOSIS — N200 Calculus of kidney: Secondary | ICD-10-CM | POA: Diagnosis not present

## 2017-09-27 DIAGNOSIS — M1712 Unilateral primary osteoarthritis, left knee: Secondary | ICD-10-CM | POA: Diagnosis not present

## 2017-10-03 DIAGNOSIS — M1711 Unilateral primary osteoarthritis, right knee: Secondary | ICD-10-CM | POA: Diagnosis not present

## 2017-10-11 DIAGNOSIS — R1031 Right lower quadrant pain: Secondary | ICD-10-CM | POA: Diagnosis not present

## 2017-10-11 DIAGNOSIS — N200 Calculus of kidney: Secondary | ICD-10-CM | POA: Diagnosis not present

## 2017-10-20 DIAGNOSIS — N200 Calculus of kidney: Secondary | ICD-10-CM | POA: Diagnosis not present

## 2017-10-20 DIAGNOSIS — E119 Type 2 diabetes mellitus without complications: Secondary | ICD-10-CM | POA: Diagnosis not present

## 2017-10-20 HISTORY — PX: LITHOTRIPSY: SUR834

## 2017-10-25 DIAGNOSIS — Z87442 Personal history of urinary calculi: Secondary | ICD-10-CM | POA: Diagnosis not present

## 2017-10-25 DIAGNOSIS — N200 Calculus of kidney: Secondary | ICD-10-CM | POA: Diagnosis not present

## 2017-10-25 DIAGNOSIS — R1031 Right lower quadrant pain: Secondary | ICD-10-CM | POA: Diagnosis not present

## 2017-11-20 DIAGNOSIS — Z9181 History of falling: Secondary | ICD-10-CM | POA: Diagnosis not present

## 2017-11-20 DIAGNOSIS — Z1331 Encounter for screening for depression: Secondary | ICD-10-CM | POA: Diagnosis not present

## 2017-11-20 DIAGNOSIS — Z139 Encounter for screening, unspecified: Secondary | ICD-10-CM | POA: Diagnosis not present

## 2017-11-20 DIAGNOSIS — K760 Fatty (change of) liver, not elsewhere classified: Secondary | ICD-10-CM | POA: Diagnosis not present

## 2017-11-20 DIAGNOSIS — Z136 Encounter for screening for cardiovascular disorders: Secondary | ICD-10-CM | POA: Diagnosis not present

## 2017-11-20 DIAGNOSIS — Z0000 Encounter for general adult medical examination without abnormal findings: Secondary | ICD-10-CM | POA: Diagnosis not present

## 2017-11-20 DIAGNOSIS — E1165 Type 2 diabetes mellitus with hyperglycemia: Secondary | ICD-10-CM | POA: Diagnosis not present

## 2017-11-20 DIAGNOSIS — Z1339 Encounter for screening examination for other mental health and behavioral disorders: Secondary | ICD-10-CM | POA: Diagnosis not present

## 2017-11-20 DIAGNOSIS — E785 Hyperlipidemia, unspecified: Secondary | ICD-10-CM | POA: Diagnosis not present

## 2017-11-20 DIAGNOSIS — E669 Obesity, unspecified: Secondary | ICD-10-CM | POA: Diagnosis not present

## 2017-11-20 DIAGNOSIS — E782 Mixed hyperlipidemia: Secondary | ICD-10-CM | POA: Diagnosis not present

## 2017-11-20 DIAGNOSIS — Z6831 Body mass index (BMI) 31.0-31.9, adult: Secondary | ICD-10-CM | POA: Diagnosis not present

## 2017-11-20 DIAGNOSIS — E1129 Type 2 diabetes mellitus with other diabetic kidney complication: Secondary | ICD-10-CM | POA: Diagnosis not present

## 2017-12-06 DIAGNOSIS — N200 Calculus of kidney: Secondary | ICD-10-CM | POA: Diagnosis not present

## 2018-01-03 DIAGNOSIS — Z23 Encounter for immunization: Secondary | ICD-10-CM | POA: Diagnosis not present

## 2018-01-15 DIAGNOSIS — M1712 Unilateral primary osteoarthritis, left knee: Secondary | ICD-10-CM | POA: Diagnosis not present

## 2018-01-23 DIAGNOSIS — M545 Low back pain: Secondary | ICD-10-CM | POA: Diagnosis not present

## 2018-01-25 DIAGNOSIS — M47816 Spondylosis without myelopathy or radiculopathy, lumbar region: Secondary | ICD-10-CM | POA: Diagnosis not present

## 2018-01-25 DIAGNOSIS — M545 Low back pain: Secondary | ICD-10-CM | POA: Diagnosis not present

## 2018-01-29 DIAGNOSIS — E119 Type 2 diabetes mellitus without complications: Secondary | ICD-10-CM | POA: Diagnosis not present

## 2018-01-29 DIAGNOSIS — H2703 Aphakia, bilateral: Secondary | ICD-10-CM | POA: Diagnosis not present

## 2018-03-28 DIAGNOSIS — Z6832 Body mass index (BMI) 32.0-32.9, adult: Secondary | ICD-10-CM | POA: Diagnosis not present

## 2018-03-28 DIAGNOSIS — I10 Essential (primary) hypertension: Secondary | ICD-10-CM | POA: Diagnosis not present

## 2018-03-28 DIAGNOSIS — E782 Mixed hyperlipidemia: Secondary | ICD-10-CM | POA: Diagnosis not present

## 2018-03-28 DIAGNOSIS — K760 Fatty (change of) liver, not elsewhere classified: Secondary | ICD-10-CM | POA: Diagnosis not present

## 2018-03-28 DIAGNOSIS — E1165 Type 2 diabetes mellitus with hyperglycemia: Secondary | ICD-10-CM | POA: Diagnosis not present

## 2018-04-05 DIAGNOSIS — M4316 Spondylolisthesis, lumbar region: Secondary | ICD-10-CM | POA: Diagnosis not present

## 2018-04-05 DIAGNOSIS — M545 Low back pain: Secondary | ICD-10-CM | POA: Diagnosis not present

## 2018-04-05 DIAGNOSIS — M533 Sacrococcygeal disorders, not elsewhere classified: Secondary | ICD-10-CM | POA: Diagnosis not present

## 2018-04-17 DIAGNOSIS — M25562 Pain in left knee: Secondary | ICD-10-CM | POA: Diagnosis not present

## 2018-04-19 DIAGNOSIS — M533 Sacrococcygeal disorders, not elsewhere classified: Secondary | ICD-10-CM | POA: Diagnosis not present

## 2018-04-19 DIAGNOSIS — M545 Low back pain: Secondary | ICD-10-CM | POA: Diagnosis not present

## 2018-05-31 DIAGNOSIS — M545 Low back pain: Secondary | ICD-10-CM | POA: Diagnosis not present

## 2018-05-31 DIAGNOSIS — M4316 Spondylolisthesis, lumbar region: Secondary | ICD-10-CM | POA: Diagnosis not present

## 2018-05-31 DIAGNOSIS — M533 Sacrococcygeal disorders, not elsewhere classified: Secondary | ICD-10-CM | POA: Diagnosis not present

## 2018-06-20 DIAGNOSIS — K760 Fatty (change of) liver, not elsewhere classified: Secondary | ICD-10-CM | POA: Diagnosis not present

## 2018-06-20 DIAGNOSIS — I10 Essential (primary) hypertension: Secondary | ICD-10-CM | POA: Diagnosis not present

## 2018-06-20 DIAGNOSIS — E1165 Type 2 diabetes mellitus with hyperglycemia: Secondary | ICD-10-CM | POA: Diagnosis not present

## 2018-06-20 DIAGNOSIS — E782 Mixed hyperlipidemia: Secondary | ICD-10-CM | POA: Diagnosis not present

## 2018-09-24 DIAGNOSIS — M79662 Pain in left lower leg: Secondary | ICD-10-CM | POA: Diagnosis not present

## 2018-09-24 DIAGNOSIS — E782 Mixed hyperlipidemia: Secondary | ICD-10-CM | POA: Diagnosis not present

## 2018-09-24 DIAGNOSIS — I10 Essential (primary) hypertension: Secondary | ICD-10-CM | POA: Diagnosis not present

## 2018-09-24 DIAGNOSIS — R600 Localized edema: Secondary | ICD-10-CM | POA: Diagnosis not present

## 2018-09-24 DIAGNOSIS — E1165 Type 2 diabetes mellitus with hyperglycemia: Secondary | ICD-10-CM | POA: Diagnosis not present

## 2018-09-24 DIAGNOSIS — K219 Gastro-esophageal reflux disease without esophagitis: Secondary | ICD-10-CM | POA: Diagnosis not present

## 2018-09-24 DIAGNOSIS — M7989 Other specified soft tissue disorders: Secondary | ICD-10-CM | POA: Diagnosis not present

## 2018-10-02 DIAGNOSIS — L0390 Cellulitis, unspecified: Secondary | ICD-10-CM | POA: Diagnosis not present

## 2018-10-02 DIAGNOSIS — K760 Fatty (change of) liver, not elsewhere classified: Secondary | ICD-10-CM | POA: Diagnosis not present

## 2018-10-02 DIAGNOSIS — R300 Dysuria: Secondary | ICD-10-CM | POA: Diagnosis not present

## 2018-10-04 DIAGNOSIS — M1712 Unilateral primary osteoarthritis, left knee: Secondary | ICD-10-CM | POA: Diagnosis not present

## 2018-11-01 DIAGNOSIS — M545 Low back pain: Secondary | ICD-10-CM | POA: Diagnosis not present

## 2018-11-01 DIAGNOSIS — M533 Sacrococcygeal disorders, not elsewhere classified: Secondary | ICD-10-CM | POA: Diagnosis not present

## 2018-11-27 DIAGNOSIS — Z139 Encounter for screening, unspecified: Secondary | ICD-10-CM | POA: Diagnosis not present

## 2018-11-27 DIAGNOSIS — Z0000 Encounter for general adult medical examination without abnormal findings: Secondary | ICD-10-CM | POA: Diagnosis not present

## 2018-11-27 DIAGNOSIS — E785 Hyperlipidemia, unspecified: Secondary | ICD-10-CM | POA: Diagnosis not present

## 2018-11-27 DIAGNOSIS — Z1211 Encounter for screening for malignant neoplasm of colon: Secondary | ICD-10-CM | POA: Diagnosis not present

## 2018-11-27 DIAGNOSIS — Z1231 Encounter for screening mammogram for malignant neoplasm of breast: Secondary | ICD-10-CM | POA: Diagnosis not present

## 2018-11-27 DIAGNOSIS — N959 Unspecified menopausal and perimenopausal disorder: Secondary | ICD-10-CM | POA: Diagnosis not present

## 2018-11-27 DIAGNOSIS — Z1331 Encounter for screening for depression: Secondary | ICD-10-CM | POA: Diagnosis not present

## 2018-11-27 DIAGNOSIS — Z6831 Body mass index (BMI) 31.0-31.9, adult: Secondary | ICD-10-CM | POA: Diagnosis not present

## 2018-12-17 DIAGNOSIS — Z23 Encounter for immunization: Secondary | ICD-10-CM | POA: Diagnosis not present

## 2018-12-21 DIAGNOSIS — M1711 Unilateral primary osteoarthritis, right knee: Secondary | ICD-10-CM | POA: Diagnosis not present

## 2019-01-08 DIAGNOSIS — M1712 Unilateral primary osteoarthritis, left knee: Secondary | ICD-10-CM | POA: Diagnosis not present

## 2019-01-08 DIAGNOSIS — M25462 Effusion, left knee: Secondary | ICD-10-CM | POA: Diagnosis not present

## 2019-01-14 DIAGNOSIS — G8929 Other chronic pain: Secondary | ICD-10-CM | POA: Diagnosis not present

## 2019-01-14 DIAGNOSIS — M545 Low back pain: Secondary | ICD-10-CM | POA: Diagnosis not present

## 2019-01-14 DIAGNOSIS — M533 Sacrococcygeal disorders, not elsewhere classified: Secondary | ICD-10-CM | POA: Diagnosis not present

## 2019-02-01 DIAGNOSIS — H2703 Aphakia, bilateral: Secondary | ICD-10-CM | POA: Diagnosis not present

## 2019-02-01 DIAGNOSIS — E119 Type 2 diabetes mellitus without complications: Secondary | ICD-10-CM | POA: Diagnosis not present

## 2019-02-05 DIAGNOSIS — I10 Essential (primary) hypertension: Secondary | ICD-10-CM | POA: Diagnosis not present

## 2019-02-05 DIAGNOSIS — L0390 Cellulitis, unspecified: Secondary | ICD-10-CM | POA: Diagnosis not present

## 2019-02-05 DIAGNOSIS — Z1211 Encounter for screening for malignant neoplasm of colon: Secondary | ICD-10-CM | POA: Diagnosis not present

## 2019-02-05 DIAGNOSIS — K760 Fatty (change of) liver, not elsewhere classified: Secondary | ICD-10-CM | POA: Diagnosis not present

## 2019-02-05 DIAGNOSIS — E1169 Type 2 diabetes mellitus with other specified complication: Secondary | ICD-10-CM | POA: Diagnosis not present

## 2019-02-05 DIAGNOSIS — E1129 Type 2 diabetes mellitus with other diabetic kidney complication: Secondary | ICD-10-CM | POA: Diagnosis not present

## 2019-04-18 DIAGNOSIS — K219 Gastro-esophageal reflux disease without esophagitis: Secondary | ICD-10-CM | POA: Diagnosis not present

## 2019-04-18 DIAGNOSIS — R300 Dysuria: Secondary | ICD-10-CM | POA: Diagnosis not present

## 2019-04-18 DIAGNOSIS — R809 Proteinuria, unspecified: Secondary | ICD-10-CM | POA: Diagnosis not present

## 2019-04-18 DIAGNOSIS — E1129 Type 2 diabetes mellitus with other diabetic kidney complication: Secondary | ICD-10-CM | POA: Diagnosis not present

## 2019-04-18 DIAGNOSIS — I10 Essential (primary) hypertension: Secondary | ICD-10-CM | POA: Diagnosis not present

## 2019-04-18 DIAGNOSIS — K760 Fatty (change of) liver, not elsewhere classified: Secondary | ICD-10-CM | POA: Diagnosis not present

## 2019-04-18 DIAGNOSIS — D649 Anemia, unspecified: Secondary | ICD-10-CM | POA: Diagnosis not present

## 2019-04-18 DIAGNOSIS — E1169 Type 2 diabetes mellitus with other specified complication: Secondary | ICD-10-CM | POA: Diagnosis not present

## 2019-05-24 ENCOUNTER — Telehealth: Payer: Self-pay | Admitting: Radiology

## 2019-05-24 NOTE — Telephone Encounter (Signed)
Received fax request for refill on Softclix Lancets. I called pharmacy and advised refill rx sent to incorrect physician.

## 2019-05-28 DIAGNOSIS — M1712 Unilateral primary osteoarthritis, left knee: Secondary | ICD-10-CM | POA: Diagnosis not present

## 2019-06-11 DIAGNOSIS — B948 Sequelae of other specified infectious and parasitic diseases: Secondary | ICD-10-CM | POA: Diagnosis not present

## 2019-06-13 DIAGNOSIS — Z9889 Other specified postprocedural states: Secondary | ICD-10-CM | POA: Diagnosis not present

## 2019-06-21 DIAGNOSIS — S46012A Strain of muscle(s) and tendon(s) of the rotator cuff of left shoulder, initial encounter: Secondary | ICD-10-CM | POA: Diagnosis not present

## 2019-06-21 DIAGNOSIS — M75102 Unspecified rotator cuff tear or rupture of left shoulder, not specified as traumatic: Secondary | ICD-10-CM | POA: Diagnosis not present

## 2019-06-21 DIAGNOSIS — M25512 Pain in left shoulder: Secondary | ICD-10-CM | POA: Diagnosis not present

## 2019-07-03 DIAGNOSIS — M75112 Incomplete rotator cuff tear or rupture of left shoulder, not specified as traumatic: Secondary | ICD-10-CM | POA: Diagnosis not present

## 2019-07-03 DIAGNOSIS — S4992XA Unspecified injury of left shoulder and upper arm, initial encounter: Secondary | ICD-10-CM | POA: Diagnosis not present

## 2019-07-09 DIAGNOSIS — M25512 Pain in left shoulder: Secondary | ICD-10-CM | POA: Diagnosis not present

## 2019-07-09 DIAGNOSIS — M25612 Stiffness of left shoulder, not elsewhere classified: Secondary | ICD-10-CM | POA: Diagnosis not present

## 2019-07-09 DIAGNOSIS — M75112 Incomplete rotator cuff tear or rupture of left shoulder, not specified as traumatic: Secondary | ICD-10-CM | POA: Diagnosis not present

## 2019-07-09 DIAGNOSIS — M6281 Muscle weakness (generalized): Secondary | ICD-10-CM | POA: Diagnosis not present

## 2019-07-11 DIAGNOSIS — M25612 Stiffness of left shoulder, not elsewhere classified: Secondary | ICD-10-CM | POA: Diagnosis not present

## 2019-07-11 DIAGNOSIS — M6281 Muscle weakness (generalized): Secondary | ICD-10-CM | POA: Diagnosis not present

## 2019-07-11 DIAGNOSIS — M75112 Incomplete rotator cuff tear or rupture of left shoulder, not specified as traumatic: Secondary | ICD-10-CM | POA: Diagnosis not present

## 2019-07-11 DIAGNOSIS — M25512 Pain in left shoulder: Secondary | ICD-10-CM | POA: Diagnosis not present

## 2019-07-15 DIAGNOSIS — M25612 Stiffness of left shoulder, not elsewhere classified: Secondary | ICD-10-CM | POA: Diagnosis not present

## 2019-07-15 DIAGNOSIS — M25512 Pain in left shoulder: Secondary | ICD-10-CM | POA: Diagnosis not present

## 2019-07-15 DIAGNOSIS — M75112 Incomplete rotator cuff tear or rupture of left shoulder, not specified as traumatic: Secondary | ICD-10-CM | POA: Diagnosis not present

## 2019-07-15 DIAGNOSIS — M6281 Muscle weakness (generalized): Secondary | ICD-10-CM | POA: Diagnosis not present

## 2019-07-17 DIAGNOSIS — M75112 Incomplete rotator cuff tear or rupture of left shoulder, not specified as traumatic: Secondary | ICD-10-CM | POA: Diagnosis not present

## 2019-07-17 DIAGNOSIS — M6281 Muscle weakness (generalized): Secondary | ICD-10-CM | POA: Diagnosis not present

## 2019-07-17 DIAGNOSIS — M25512 Pain in left shoulder: Secondary | ICD-10-CM | POA: Diagnosis not present

## 2019-07-17 DIAGNOSIS — M25612 Stiffness of left shoulder, not elsewhere classified: Secondary | ICD-10-CM | POA: Diagnosis not present

## 2019-07-23 DIAGNOSIS — M6281 Muscle weakness (generalized): Secondary | ICD-10-CM | POA: Diagnosis not present

## 2019-07-23 DIAGNOSIS — M25512 Pain in left shoulder: Secondary | ICD-10-CM | POA: Diagnosis not present

## 2019-07-23 DIAGNOSIS — M75112 Incomplete rotator cuff tear or rupture of left shoulder, not specified as traumatic: Secondary | ICD-10-CM | POA: Diagnosis not present

## 2019-07-23 DIAGNOSIS — M25612 Stiffness of left shoulder, not elsewhere classified: Secondary | ICD-10-CM | POA: Diagnosis not present

## 2019-07-25 DIAGNOSIS — M25612 Stiffness of left shoulder, not elsewhere classified: Secondary | ICD-10-CM | POA: Diagnosis not present

## 2019-07-25 DIAGNOSIS — M6281 Muscle weakness (generalized): Secondary | ICD-10-CM | POA: Diagnosis not present

## 2019-07-25 DIAGNOSIS — M25512 Pain in left shoulder: Secondary | ICD-10-CM | POA: Diagnosis not present

## 2019-07-25 DIAGNOSIS — M75112 Incomplete rotator cuff tear or rupture of left shoulder, not specified as traumatic: Secondary | ICD-10-CM | POA: Diagnosis not present

## 2019-07-29 DIAGNOSIS — M25612 Stiffness of left shoulder, not elsewhere classified: Secondary | ICD-10-CM | POA: Diagnosis not present

## 2019-07-29 DIAGNOSIS — M25512 Pain in left shoulder: Secondary | ICD-10-CM | POA: Diagnosis not present

## 2019-07-29 DIAGNOSIS — M6281 Muscle weakness (generalized): Secondary | ICD-10-CM | POA: Diagnosis not present

## 2019-07-29 DIAGNOSIS — M75112 Incomplete rotator cuff tear or rupture of left shoulder, not specified as traumatic: Secondary | ICD-10-CM | POA: Diagnosis not present

## 2019-07-31 DIAGNOSIS — M75112 Incomplete rotator cuff tear or rupture of left shoulder, not specified as traumatic: Secondary | ICD-10-CM | POA: Diagnosis not present

## 2019-07-31 DIAGNOSIS — M25612 Stiffness of left shoulder, not elsewhere classified: Secondary | ICD-10-CM | POA: Diagnosis not present

## 2019-07-31 DIAGNOSIS — M6281 Muscle weakness (generalized): Secondary | ICD-10-CM | POA: Diagnosis not present

## 2019-07-31 DIAGNOSIS — M25512 Pain in left shoulder: Secondary | ICD-10-CM | POA: Diagnosis not present

## 2019-08-06 DIAGNOSIS — M75112 Incomplete rotator cuff tear or rupture of left shoulder, not specified as traumatic: Secondary | ICD-10-CM | POA: Diagnosis not present

## 2019-08-06 DIAGNOSIS — M6281 Muscle weakness (generalized): Secondary | ICD-10-CM | POA: Diagnosis not present

## 2019-08-06 DIAGNOSIS — M25612 Stiffness of left shoulder, not elsewhere classified: Secondary | ICD-10-CM | POA: Diagnosis not present

## 2019-08-06 DIAGNOSIS — M25512 Pain in left shoulder: Secondary | ICD-10-CM | POA: Diagnosis not present

## 2019-08-08 DIAGNOSIS — M6281 Muscle weakness (generalized): Secondary | ICD-10-CM | POA: Diagnosis not present

## 2019-08-08 DIAGNOSIS — M25512 Pain in left shoulder: Secondary | ICD-10-CM | POA: Diagnosis not present

## 2019-08-08 DIAGNOSIS — M25612 Stiffness of left shoulder, not elsewhere classified: Secondary | ICD-10-CM | POA: Diagnosis not present

## 2019-08-08 DIAGNOSIS — M75112 Incomplete rotator cuff tear or rupture of left shoulder, not specified as traumatic: Secondary | ICD-10-CM | POA: Diagnosis not present

## 2019-08-14 DIAGNOSIS — M25512 Pain in left shoulder: Secondary | ICD-10-CM | POA: Diagnosis not present

## 2019-08-26 DIAGNOSIS — D649 Anemia, unspecified: Secondary | ICD-10-CM | POA: Diagnosis not present

## 2019-08-26 DIAGNOSIS — E1129 Type 2 diabetes mellitus with other diabetic kidney complication: Secondary | ICD-10-CM | POA: Diagnosis not present

## 2019-08-26 DIAGNOSIS — I10 Essential (primary) hypertension: Secondary | ICD-10-CM | POA: Diagnosis not present

## 2019-08-26 DIAGNOSIS — K219 Gastro-esophageal reflux disease without esophagitis: Secondary | ICD-10-CM | POA: Diagnosis not present

## 2019-08-26 DIAGNOSIS — E1169 Type 2 diabetes mellitus with other specified complication: Secondary | ICD-10-CM | POA: Diagnosis not present

## 2019-08-26 DIAGNOSIS — K760 Fatty (change of) liver, not elsewhere classified: Secondary | ICD-10-CM | POA: Diagnosis not present

## 2019-08-26 DIAGNOSIS — R809 Proteinuria, unspecified: Secondary | ICD-10-CM | POA: Diagnosis not present

## 2019-10-03 DIAGNOSIS — D72829 Elevated white blood cell count, unspecified: Secondary | ICD-10-CM | POA: Diagnosis not present

## 2019-10-03 DIAGNOSIS — R748 Abnormal levels of other serum enzymes: Secondary | ICD-10-CM | POA: Diagnosis not present

## 2019-10-09 DIAGNOSIS — N200 Calculus of kidney: Secondary | ICD-10-CM | POA: Diagnosis not present

## 2019-10-09 DIAGNOSIS — R310 Gross hematuria: Secondary | ICD-10-CM | POA: Diagnosis not present

## 2019-10-21 DIAGNOSIS — D3502 Benign neoplasm of left adrenal gland: Secondary | ICD-10-CM | POA: Diagnosis not present

## 2019-10-21 DIAGNOSIS — R310 Gross hematuria: Secondary | ICD-10-CM | POA: Diagnosis not present

## 2019-10-21 DIAGNOSIS — K753 Granulomatous hepatitis, not elsewhere classified: Secondary | ICD-10-CM | POA: Diagnosis not present

## 2019-10-21 DIAGNOSIS — E278 Other specified disorders of adrenal gland: Secondary | ICD-10-CM | POA: Diagnosis not present

## 2019-10-21 DIAGNOSIS — M47816 Spondylosis without myelopathy or radiculopathy, lumbar region: Secondary | ICD-10-CM | POA: Diagnosis not present

## 2019-10-21 DIAGNOSIS — Z87442 Personal history of urinary calculi: Secondary | ICD-10-CM | POA: Diagnosis not present

## 2019-10-21 DIAGNOSIS — I700 Atherosclerosis of aorta: Secondary | ICD-10-CM | POA: Diagnosis not present

## 2019-10-21 DIAGNOSIS — R319 Hematuria, unspecified: Secondary | ICD-10-CM | POA: Diagnosis not present

## 2019-10-21 DIAGNOSIS — N200 Calculus of kidney: Secondary | ICD-10-CM | POA: Diagnosis not present

## 2019-10-21 DIAGNOSIS — D3500 Benign neoplasm of unspecified adrenal gland: Secondary | ICD-10-CM | POA: Diagnosis not present

## 2019-10-21 DIAGNOSIS — I7090 Unspecified atherosclerosis: Secondary | ICD-10-CM | POA: Diagnosis not present

## 2019-10-31 DIAGNOSIS — M1712 Unilateral primary osteoarthritis, left knee: Secondary | ICD-10-CM | POA: Diagnosis not present

## 2019-11-06 DIAGNOSIS — N200 Calculus of kidney: Secondary | ICD-10-CM | POA: Diagnosis not present

## 2019-11-06 DIAGNOSIS — R310 Gross hematuria: Secondary | ICD-10-CM | POA: Diagnosis not present

## 2019-11-06 DIAGNOSIS — N3946 Mixed incontinence: Secondary | ICD-10-CM | POA: Diagnosis not present

## 2019-11-25 DIAGNOSIS — M1712 Unilateral primary osteoarthritis, left knee: Secondary | ICD-10-CM | POA: Diagnosis not present

## 2019-12-02 DIAGNOSIS — M1712 Unilateral primary osteoarthritis, left knee: Secondary | ICD-10-CM | POA: Diagnosis not present

## 2019-12-04 DIAGNOSIS — Z9181 History of falling: Secondary | ICD-10-CM | POA: Diagnosis not present

## 2019-12-04 DIAGNOSIS — Z139 Encounter for screening, unspecified: Secondary | ICD-10-CM | POA: Diagnosis not present

## 2019-12-04 DIAGNOSIS — Z1331 Encounter for screening for depression: Secondary | ICD-10-CM | POA: Diagnosis not present

## 2019-12-04 DIAGNOSIS — Z0000 Encounter for general adult medical examination without abnormal findings: Secondary | ICD-10-CM | POA: Diagnosis not present

## 2019-12-04 DIAGNOSIS — E785 Hyperlipidemia, unspecified: Secondary | ICD-10-CM | POA: Diagnosis not present

## 2019-12-09 DIAGNOSIS — M1712 Unilateral primary osteoarthritis, left knee: Secondary | ICD-10-CM | POA: Diagnosis not present

## 2019-12-26 DIAGNOSIS — K760 Fatty (change of) liver, not elsewhere classified: Secondary | ICD-10-CM | POA: Diagnosis not present

## 2019-12-26 DIAGNOSIS — E1129 Type 2 diabetes mellitus with other diabetic kidney complication: Secondary | ICD-10-CM | POA: Diagnosis not present

## 2019-12-26 DIAGNOSIS — R809 Proteinuria, unspecified: Secondary | ICD-10-CM | POA: Diagnosis not present

## 2019-12-26 DIAGNOSIS — D649 Anemia, unspecified: Secondary | ICD-10-CM | POA: Diagnosis not present

## 2019-12-26 DIAGNOSIS — I10 Essential (primary) hypertension: Secondary | ICD-10-CM | POA: Diagnosis not present

## 2019-12-26 DIAGNOSIS — E1169 Type 2 diabetes mellitus with other specified complication: Secondary | ICD-10-CM | POA: Diagnosis not present

## 2019-12-26 DIAGNOSIS — R609 Edema, unspecified: Secondary | ICD-10-CM | POA: Diagnosis not present

## 2019-12-26 DIAGNOSIS — K219 Gastro-esophageal reflux disease without esophagitis: Secondary | ICD-10-CM | POA: Diagnosis not present

## 2019-12-26 DIAGNOSIS — Z1239 Encounter for other screening for malignant neoplasm of breast: Secondary | ICD-10-CM | POA: Diagnosis not present

## 2019-12-26 DIAGNOSIS — F430 Acute stress reaction: Secondary | ICD-10-CM | POA: Diagnosis not present

## 2019-12-26 DIAGNOSIS — E785 Hyperlipidemia, unspecified: Secondary | ICD-10-CM | POA: Diagnosis not present

## 2019-12-26 DIAGNOSIS — J4520 Mild intermittent asthma, uncomplicated: Secondary | ICD-10-CM | POA: Diagnosis not present

## 2020-01-03 DIAGNOSIS — Z1231 Encounter for screening mammogram for malignant neoplasm of breast: Secondary | ICD-10-CM | POA: Diagnosis not present

## 2020-01-13 DIAGNOSIS — Z23 Encounter for immunization: Secondary | ICD-10-CM | POA: Diagnosis not present

## 2020-01-15 DIAGNOSIS — R748 Abnormal levels of other serum enzymes: Secondary | ICD-10-CM | POA: Diagnosis not present

## 2020-01-15 DIAGNOSIS — E612 Magnesium deficiency: Secondary | ICD-10-CM | POA: Diagnosis not present

## 2020-01-21 DIAGNOSIS — H2703 Aphakia, bilateral: Secondary | ICD-10-CM | POA: Diagnosis not present

## 2020-01-21 DIAGNOSIS — E119 Type 2 diabetes mellitus without complications: Secondary | ICD-10-CM | POA: Diagnosis not present

## 2020-02-18 DIAGNOSIS — Z23 Encounter for immunization: Secondary | ICD-10-CM | POA: Diagnosis not present

## 2020-03-11 DIAGNOSIS — Z6829 Body mass index (BMI) 29.0-29.9, adult: Secondary | ICD-10-CM | POA: Diagnosis not present

## 2020-03-11 DIAGNOSIS — L989 Disorder of the skin and subcutaneous tissue, unspecified: Secondary | ICD-10-CM | POA: Diagnosis not present

## 2020-03-23 DIAGNOSIS — Z01818 Encounter for other preprocedural examination: Secondary | ICD-10-CM | POA: Diagnosis not present

## 2020-04-04 DIAGNOSIS — M25511 Pain in right shoulder: Secondary | ICD-10-CM | POA: Diagnosis not present

## 2020-04-04 DIAGNOSIS — M6580 Other synovitis and tenosynovitis, unspecified site: Secondary | ICD-10-CM | POA: Diagnosis not present

## 2020-04-04 DIAGNOSIS — M79601 Pain in right arm: Secondary | ICD-10-CM | POA: Diagnosis not present

## 2020-04-04 DIAGNOSIS — S50311A Abrasion of right elbow, initial encounter: Secondary | ICD-10-CM | POA: Diagnosis not present

## 2020-04-04 DIAGNOSIS — M25521 Pain in right elbow: Secondary | ICD-10-CM | POA: Diagnosis not present

## 2020-04-06 DIAGNOSIS — M1811 Unilateral primary osteoarthritis of first carpometacarpal joint, right hand: Secondary | ICD-10-CM | POA: Diagnosis not present

## 2020-04-06 DIAGNOSIS — M25511 Pain in right shoulder: Secondary | ICD-10-CM | POA: Diagnosis not present

## 2020-04-06 DIAGNOSIS — M79641 Pain in right hand: Secondary | ICD-10-CM | POA: Diagnosis not present

## 2020-04-16 DIAGNOSIS — M25511 Pain in right shoulder: Secondary | ICD-10-CM | POA: Diagnosis not present

## 2020-04-16 DIAGNOSIS — M79641 Pain in right hand: Secondary | ICD-10-CM | POA: Diagnosis not present

## 2020-04-16 DIAGNOSIS — M1811 Unilateral primary osteoarthritis of first carpometacarpal joint, right hand: Secondary | ICD-10-CM | POA: Diagnosis not present

## 2020-04-22 DIAGNOSIS — M25511 Pain in right shoulder: Secondary | ICD-10-CM | POA: Diagnosis not present

## 2020-04-30 DIAGNOSIS — L659 Nonscarring hair loss, unspecified: Secondary | ICD-10-CM | POA: Diagnosis not present

## 2020-04-30 DIAGNOSIS — D649 Anemia, unspecified: Secondary | ICD-10-CM | POA: Diagnosis not present

## 2020-04-30 DIAGNOSIS — K219 Gastro-esophageal reflux disease without esophagitis: Secondary | ICD-10-CM | POA: Diagnosis not present

## 2020-04-30 DIAGNOSIS — E785 Hyperlipidemia, unspecified: Secondary | ICD-10-CM | POA: Diagnosis not present

## 2020-04-30 DIAGNOSIS — B372 Candidiasis of skin and nail: Secondary | ICD-10-CM | POA: Diagnosis not present

## 2020-04-30 DIAGNOSIS — R609 Edema, unspecified: Secondary | ICD-10-CM | POA: Diagnosis not present

## 2020-04-30 DIAGNOSIS — S46811A Strain of other muscles, fascia and tendons at shoulder and upper arm level, right arm, initial encounter: Secondary | ICD-10-CM | POA: Diagnosis not present

## 2020-04-30 DIAGNOSIS — M79641 Pain in right hand: Secondary | ICD-10-CM | POA: Diagnosis not present

## 2020-04-30 DIAGNOSIS — E1169 Type 2 diabetes mellitus with other specified complication: Secondary | ICD-10-CM | POA: Diagnosis not present

## 2020-04-30 DIAGNOSIS — M25511 Pain in right shoulder: Secondary | ICD-10-CM | POA: Diagnosis not present

## 2020-04-30 DIAGNOSIS — J4520 Mild intermittent asthma, uncomplicated: Secondary | ICD-10-CM | POA: Diagnosis not present

## 2020-04-30 DIAGNOSIS — I10 Essential (primary) hypertension: Secondary | ICD-10-CM | POA: Diagnosis not present

## 2020-04-30 DIAGNOSIS — F430 Acute stress reaction: Secondary | ICD-10-CM | POA: Diagnosis not present

## 2020-04-30 DIAGNOSIS — E1129 Type 2 diabetes mellitus with other diabetic kidney complication: Secondary | ICD-10-CM | POA: Diagnosis not present

## 2020-04-30 DIAGNOSIS — R809 Proteinuria, unspecified: Secondary | ICD-10-CM | POA: Diagnosis not present

## 2020-04-30 DIAGNOSIS — K760 Fatty (change of) liver, not elsewhere classified: Secondary | ICD-10-CM | POA: Diagnosis not present

## 2020-05-08 DIAGNOSIS — N200 Calculus of kidney: Secondary | ICD-10-CM | POA: Diagnosis not present

## 2020-05-08 DIAGNOSIS — N3946 Mixed incontinence: Secondary | ICD-10-CM | POA: Diagnosis not present

## 2020-05-18 DIAGNOSIS — E785 Hyperlipidemia, unspecified: Secondary | ICD-10-CM | POA: Diagnosis not present

## 2020-05-18 DIAGNOSIS — E1169 Type 2 diabetes mellitus with other specified complication: Secondary | ICD-10-CM | POA: Diagnosis not present

## 2020-05-18 DIAGNOSIS — Z1152 Encounter for screening for COVID-19: Secondary | ICD-10-CM | POA: Diagnosis not present

## 2020-05-18 DIAGNOSIS — Z1159 Encounter for screening for other viral diseases: Secondary | ICD-10-CM | POA: Diagnosis not present

## 2020-05-18 DIAGNOSIS — J4521 Mild intermittent asthma with (acute) exacerbation: Secondary | ICD-10-CM | POA: Diagnosis not present

## 2020-05-18 DIAGNOSIS — I10 Essential (primary) hypertension: Secondary | ICD-10-CM | POA: Diagnosis not present

## 2020-05-18 DIAGNOSIS — Z01818 Encounter for other preprocedural examination: Secondary | ICD-10-CM | POA: Diagnosis not present

## 2020-05-22 DIAGNOSIS — M7551 Bursitis of right shoulder: Secondary | ICD-10-CM | POA: Diagnosis not present

## 2020-05-22 DIAGNOSIS — E119 Type 2 diabetes mellitus without complications: Secondary | ICD-10-CM | POA: Diagnosis not present

## 2020-05-22 DIAGNOSIS — M7521 Bicipital tendinitis, right shoulder: Secondary | ICD-10-CM | POA: Diagnosis not present

## 2020-05-22 DIAGNOSIS — K219 Gastro-esophageal reflux disease without esophagitis: Secondary | ICD-10-CM | POA: Diagnosis not present

## 2020-05-22 DIAGNOSIS — Z7982 Long term (current) use of aspirin: Secondary | ICD-10-CM | POA: Diagnosis not present

## 2020-05-22 DIAGNOSIS — J45909 Unspecified asthma, uncomplicated: Secondary | ICD-10-CM | POA: Diagnosis not present

## 2020-05-22 DIAGNOSIS — S46011A Strain of muscle(s) and tendon(s) of the rotator cuff of right shoulder, initial encounter: Secondary | ICD-10-CM | POA: Diagnosis not present

## 2020-05-22 DIAGNOSIS — M75101 Unspecified rotator cuff tear or rupture of right shoulder, not specified as traumatic: Secondary | ICD-10-CM | POA: Diagnosis not present

## 2020-05-22 DIAGNOSIS — I10 Essential (primary) hypertension: Secondary | ICD-10-CM | POA: Diagnosis not present

## 2020-05-22 DIAGNOSIS — Z79899 Other long term (current) drug therapy: Secondary | ICD-10-CM | POA: Diagnosis not present

## 2020-05-22 DIAGNOSIS — Z7984 Long term (current) use of oral hypoglycemic drugs: Secondary | ICD-10-CM | POA: Diagnosis not present

## 2020-05-22 DIAGNOSIS — G8918 Other acute postprocedural pain: Secondary | ICD-10-CM | POA: Diagnosis not present

## 2020-05-22 DIAGNOSIS — S46811A Strain of other muscles, fascia and tendons at shoulder and upper arm level, right arm, initial encounter: Secondary | ICD-10-CM | POA: Diagnosis not present

## 2020-05-22 HISTORY — PX: ROTATOR CUFF REPAIR: SHX139

## 2020-06-23 DIAGNOSIS — R238 Other skin changes: Secondary | ICD-10-CM | POA: Diagnosis not present

## 2020-06-23 DIAGNOSIS — B372 Candidiasis of skin and nail: Secondary | ICD-10-CM | POA: Diagnosis not present

## 2020-07-03 DIAGNOSIS — M6281 Muscle weakness (generalized): Secondary | ICD-10-CM | POA: Diagnosis not present

## 2020-07-03 DIAGNOSIS — M25411 Effusion, right shoulder: Secondary | ICD-10-CM | POA: Diagnosis not present

## 2020-07-03 DIAGNOSIS — M25611 Stiffness of right shoulder, not elsewhere classified: Secondary | ICD-10-CM | POA: Diagnosis not present

## 2020-07-03 DIAGNOSIS — M25511 Pain in right shoulder: Secondary | ICD-10-CM | POA: Diagnosis not present

## 2020-07-07 DIAGNOSIS — M6281 Muscle weakness (generalized): Secondary | ICD-10-CM | POA: Diagnosis not present

## 2020-07-07 DIAGNOSIS — M25611 Stiffness of right shoulder, not elsewhere classified: Secondary | ICD-10-CM | POA: Diagnosis not present

## 2020-07-07 DIAGNOSIS — M25511 Pain in right shoulder: Secondary | ICD-10-CM | POA: Diagnosis not present

## 2020-07-07 DIAGNOSIS — M25411 Effusion, right shoulder: Secondary | ICD-10-CM | POA: Diagnosis not present

## 2020-07-09 DIAGNOSIS — M25511 Pain in right shoulder: Secondary | ICD-10-CM | POA: Diagnosis not present

## 2020-07-09 DIAGNOSIS — M6281 Muscle weakness (generalized): Secondary | ICD-10-CM | POA: Diagnosis not present

## 2020-07-09 DIAGNOSIS — M25411 Effusion, right shoulder: Secondary | ICD-10-CM | POA: Diagnosis not present

## 2020-07-09 DIAGNOSIS — M25611 Stiffness of right shoulder, not elsewhere classified: Secondary | ICD-10-CM | POA: Diagnosis not present

## 2020-07-13 DIAGNOSIS — M25411 Effusion, right shoulder: Secondary | ICD-10-CM | POA: Diagnosis not present

## 2020-07-13 DIAGNOSIS — M6281 Muscle weakness (generalized): Secondary | ICD-10-CM | POA: Diagnosis not present

## 2020-07-13 DIAGNOSIS — M25611 Stiffness of right shoulder, not elsewhere classified: Secondary | ICD-10-CM | POA: Diagnosis not present

## 2020-07-13 DIAGNOSIS — M25511 Pain in right shoulder: Secondary | ICD-10-CM | POA: Diagnosis not present

## 2020-07-17 DIAGNOSIS — M25611 Stiffness of right shoulder, not elsewhere classified: Secondary | ICD-10-CM | POA: Diagnosis not present

## 2020-07-17 DIAGNOSIS — M25511 Pain in right shoulder: Secondary | ICD-10-CM | POA: Diagnosis not present

## 2020-07-17 DIAGNOSIS — M25411 Effusion, right shoulder: Secondary | ICD-10-CM | POA: Diagnosis not present

## 2020-07-17 DIAGNOSIS — M6281 Muscle weakness (generalized): Secondary | ICD-10-CM | POA: Diagnosis not present

## 2020-07-21 DIAGNOSIS — M6281 Muscle weakness (generalized): Secondary | ICD-10-CM | POA: Diagnosis not present

## 2020-07-21 DIAGNOSIS — M25411 Effusion, right shoulder: Secondary | ICD-10-CM | POA: Diagnosis not present

## 2020-07-21 DIAGNOSIS — M25611 Stiffness of right shoulder, not elsewhere classified: Secondary | ICD-10-CM | POA: Diagnosis not present

## 2020-07-21 DIAGNOSIS — M25511 Pain in right shoulder: Secondary | ICD-10-CM | POA: Diagnosis not present

## 2020-07-23 DIAGNOSIS — M6281 Muscle weakness (generalized): Secondary | ICD-10-CM | POA: Diagnosis not present

## 2020-07-23 DIAGNOSIS — M25411 Effusion, right shoulder: Secondary | ICD-10-CM | POA: Diagnosis not present

## 2020-07-23 DIAGNOSIS — M25511 Pain in right shoulder: Secondary | ICD-10-CM | POA: Diagnosis not present

## 2020-07-23 DIAGNOSIS — M25611 Stiffness of right shoulder, not elsewhere classified: Secondary | ICD-10-CM | POA: Diagnosis not present

## 2020-07-27 DIAGNOSIS — M6281 Muscle weakness (generalized): Secondary | ICD-10-CM | POA: Diagnosis not present

## 2020-07-27 DIAGNOSIS — M25411 Effusion, right shoulder: Secondary | ICD-10-CM | POA: Diagnosis not present

## 2020-07-27 DIAGNOSIS — M25611 Stiffness of right shoulder, not elsewhere classified: Secondary | ICD-10-CM | POA: Diagnosis not present

## 2020-07-27 DIAGNOSIS — M25511 Pain in right shoulder: Secondary | ICD-10-CM | POA: Diagnosis not present

## 2020-07-31 DIAGNOSIS — M25511 Pain in right shoulder: Secondary | ICD-10-CM | POA: Diagnosis not present

## 2020-07-31 DIAGNOSIS — M25411 Effusion, right shoulder: Secondary | ICD-10-CM | POA: Diagnosis not present

## 2020-07-31 DIAGNOSIS — M25611 Stiffness of right shoulder, not elsewhere classified: Secondary | ICD-10-CM | POA: Diagnosis not present

## 2020-07-31 DIAGNOSIS — M6281 Muscle weakness (generalized): Secondary | ICD-10-CM | POA: Diagnosis not present

## 2020-08-04 DIAGNOSIS — M6281 Muscle weakness (generalized): Secondary | ICD-10-CM | POA: Diagnosis not present

## 2020-08-04 DIAGNOSIS — M25511 Pain in right shoulder: Secondary | ICD-10-CM | POA: Diagnosis not present

## 2020-08-04 DIAGNOSIS — M25611 Stiffness of right shoulder, not elsewhere classified: Secondary | ICD-10-CM | POA: Diagnosis not present

## 2020-08-04 DIAGNOSIS — M25411 Effusion, right shoulder: Secondary | ICD-10-CM | POA: Diagnosis not present

## 2020-08-06 DIAGNOSIS — M25411 Effusion, right shoulder: Secondary | ICD-10-CM | POA: Diagnosis not present

## 2020-08-06 DIAGNOSIS — M6281 Muscle weakness (generalized): Secondary | ICD-10-CM | POA: Diagnosis not present

## 2020-08-06 DIAGNOSIS — M25511 Pain in right shoulder: Secondary | ICD-10-CM | POA: Diagnosis not present

## 2020-08-06 DIAGNOSIS — M25611 Stiffness of right shoulder, not elsewhere classified: Secondary | ICD-10-CM | POA: Diagnosis not present

## 2020-08-12 DIAGNOSIS — M25411 Effusion, right shoulder: Secondary | ICD-10-CM | POA: Diagnosis not present

## 2020-08-12 DIAGNOSIS — M6281 Muscle weakness (generalized): Secondary | ICD-10-CM | POA: Diagnosis not present

## 2020-08-12 DIAGNOSIS — M25611 Stiffness of right shoulder, not elsewhere classified: Secondary | ICD-10-CM | POA: Diagnosis not present

## 2020-08-12 DIAGNOSIS — M25511 Pain in right shoulder: Secondary | ICD-10-CM | POA: Diagnosis not present

## 2020-08-14 DIAGNOSIS — M25611 Stiffness of right shoulder, not elsewhere classified: Secondary | ICD-10-CM | POA: Diagnosis not present

## 2020-08-14 DIAGNOSIS — M25511 Pain in right shoulder: Secondary | ICD-10-CM | POA: Diagnosis not present

## 2020-08-14 DIAGNOSIS — M6281 Muscle weakness (generalized): Secondary | ICD-10-CM | POA: Diagnosis not present

## 2020-08-14 DIAGNOSIS — M25411 Effusion, right shoulder: Secondary | ICD-10-CM | POA: Diagnosis not present

## 2020-08-18 DIAGNOSIS — M25611 Stiffness of right shoulder, not elsewhere classified: Secondary | ICD-10-CM | POA: Diagnosis not present

## 2020-08-18 DIAGNOSIS — M545 Low back pain, unspecified: Secondary | ICD-10-CM | POA: Diagnosis not present

## 2020-08-18 DIAGNOSIS — M6281 Muscle weakness (generalized): Secondary | ICD-10-CM | POA: Diagnosis not present

## 2020-08-18 DIAGNOSIS — M25511 Pain in right shoulder: Secondary | ICD-10-CM | POA: Diagnosis not present

## 2020-08-18 DIAGNOSIS — M5416 Radiculopathy, lumbar region: Secondary | ICD-10-CM | POA: Diagnosis not present

## 2020-08-18 DIAGNOSIS — M25411 Effusion, right shoulder: Secondary | ICD-10-CM | POA: Diagnosis not present

## 2020-08-20 DIAGNOSIS — M25411 Effusion, right shoulder: Secondary | ICD-10-CM | POA: Diagnosis not present

## 2020-08-20 DIAGNOSIS — M25511 Pain in right shoulder: Secondary | ICD-10-CM | POA: Diagnosis not present

## 2020-08-20 DIAGNOSIS — M25611 Stiffness of right shoulder, not elsewhere classified: Secondary | ICD-10-CM | POA: Diagnosis not present

## 2020-08-20 DIAGNOSIS — M6281 Muscle weakness (generalized): Secondary | ICD-10-CM | POA: Diagnosis not present

## 2020-08-24 DIAGNOSIS — M25611 Stiffness of right shoulder, not elsewhere classified: Secondary | ICD-10-CM | POA: Diagnosis not present

## 2020-08-24 DIAGNOSIS — M25511 Pain in right shoulder: Secondary | ICD-10-CM | POA: Diagnosis not present

## 2020-08-24 DIAGNOSIS — M6281 Muscle weakness (generalized): Secondary | ICD-10-CM | POA: Diagnosis not present

## 2020-08-24 DIAGNOSIS — M25411 Effusion, right shoulder: Secondary | ICD-10-CM | POA: Diagnosis not present

## 2020-08-28 DIAGNOSIS — M6281 Muscle weakness (generalized): Secondary | ICD-10-CM | POA: Diagnosis not present

## 2020-08-28 DIAGNOSIS — M25611 Stiffness of right shoulder, not elsewhere classified: Secondary | ICD-10-CM | POA: Diagnosis not present

## 2020-08-28 DIAGNOSIS — M25511 Pain in right shoulder: Secondary | ICD-10-CM | POA: Diagnosis not present

## 2020-08-28 DIAGNOSIS — M25411 Effusion, right shoulder: Secondary | ICD-10-CM | POA: Diagnosis not present

## 2020-09-01 DIAGNOSIS — M25611 Stiffness of right shoulder, not elsewhere classified: Secondary | ICD-10-CM | POA: Diagnosis not present

## 2020-09-01 DIAGNOSIS — M6281 Muscle weakness (generalized): Secondary | ICD-10-CM | POA: Diagnosis not present

## 2020-09-01 DIAGNOSIS — M25411 Effusion, right shoulder: Secondary | ICD-10-CM | POA: Diagnosis not present

## 2020-09-01 DIAGNOSIS — M25511 Pain in right shoulder: Secondary | ICD-10-CM | POA: Diagnosis not present

## 2020-09-03 DIAGNOSIS — M6281 Muscle weakness (generalized): Secondary | ICD-10-CM | POA: Diagnosis not present

## 2020-09-03 DIAGNOSIS — E1169 Type 2 diabetes mellitus with other specified complication: Secondary | ICD-10-CM | POA: Diagnosis not present

## 2020-09-03 DIAGNOSIS — E785 Hyperlipidemia, unspecified: Secondary | ICD-10-CM | POA: Diagnosis not present

## 2020-09-03 DIAGNOSIS — R609 Edema, unspecified: Secondary | ICD-10-CM | POA: Diagnosis not present

## 2020-09-03 DIAGNOSIS — R809 Proteinuria, unspecified: Secondary | ICD-10-CM | POA: Diagnosis not present

## 2020-09-03 DIAGNOSIS — I10 Essential (primary) hypertension: Secondary | ICD-10-CM | POA: Diagnosis not present

## 2020-09-03 DIAGNOSIS — J4520 Mild intermittent asthma, uncomplicated: Secondary | ICD-10-CM | POA: Diagnosis not present

## 2020-09-03 DIAGNOSIS — M25411 Effusion, right shoulder: Secondary | ICD-10-CM | POA: Diagnosis not present

## 2020-09-03 DIAGNOSIS — F430 Acute stress reaction: Secondary | ICD-10-CM | POA: Diagnosis not present

## 2020-09-03 DIAGNOSIS — K760 Fatty (change of) liver, not elsewhere classified: Secondary | ICD-10-CM | POA: Diagnosis not present

## 2020-09-03 DIAGNOSIS — M25511 Pain in right shoulder: Secondary | ICD-10-CM | POA: Diagnosis not present

## 2020-09-03 DIAGNOSIS — J069 Acute upper respiratory infection, unspecified: Secondary | ICD-10-CM | POA: Diagnosis not present

## 2020-09-03 DIAGNOSIS — E1129 Type 2 diabetes mellitus with other diabetic kidney complication: Secondary | ICD-10-CM | POA: Diagnosis not present

## 2020-09-03 DIAGNOSIS — D649 Anemia, unspecified: Secondary | ICD-10-CM | POA: Diagnosis not present

## 2020-09-03 DIAGNOSIS — M25611 Stiffness of right shoulder, not elsewhere classified: Secondary | ICD-10-CM | POA: Diagnosis not present

## 2020-09-03 DIAGNOSIS — K219 Gastro-esophageal reflux disease without esophagitis: Secondary | ICD-10-CM | POA: Diagnosis not present

## 2020-09-07 DIAGNOSIS — M6281 Muscle weakness (generalized): Secondary | ICD-10-CM | POA: Diagnosis not present

## 2020-09-07 DIAGNOSIS — M25511 Pain in right shoulder: Secondary | ICD-10-CM | POA: Diagnosis not present

## 2020-09-07 DIAGNOSIS — M25411 Effusion, right shoulder: Secondary | ICD-10-CM | POA: Diagnosis not present

## 2020-09-07 DIAGNOSIS — M25611 Stiffness of right shoulder, not elsewhere classified: Secondary | ICD-10-CM | POA: Diagnosis not present

## 2020-09-11 DIAGNOSIS — M25411 Effusion, right shoulder: Secondary | ICD-10-CM | POA: Diagnosis not present

## 2020-09-11 DIAGNOSIS — M25611 Stiffness of right shoulder, not elsewhere classified: Secondary | ICD-10-CM | POA: Diagnosis not present

## 2020-09-11 DIAGNOSIS — M6281 Muscle weakness (generalized): Secondary | ICD-10-CM | POA: Diagnosis not present

## 2020-09-11 DIAGNOSIS — M25511 Pain in right shoulder: Secondary | ICD-10-CM | POA: Diagnosis not present

## 2020-09-17 DIAGNOSIS — M25411 Effusion, right shoulder: Secondary | ICD-10-CM | POA: Diagnosis not present

## 2020-09-17 DIAGNOSIS — M25511 Pain in right shoulder: Secondary | ICD-10-CM | POA: Diagnosis not present

## 2020-09-17 DIAGNOSIS — M6281 Muscle weakness (generalized): Secondary | ICD-10-CM | POA: Diagnosis not present

## 2020-09-17 DIAGNOSIS — M25611 Stiffness of right shoulder, not elsewhere classified: Secondary | ICD-10-CM | POA: Diagnosis not present

## 2020-09-21 DIAGNOSIS — M25511 Pain in right shoulder: Secondary | ICD-10-CM | POA: Diagnosis not present

## 2020-09-21 DIAGNOSIS — M6281 Muscle weakness (generalized): Secondary | ICD-10-CM | POA: Diagnosis not present

## 2020-09-21 DIAGNOSIS — M25411 Effusion, right shoulder: Secondary | ICD-10-CM | POA: Diagnosis not present

## 2020-09-21 DIAGNOSIS — M25611 Stiffness of right shoulder, not elsewhere classified: Secondary | ICD-10-CM | POA: Diagnosis not present

## 2020-09-23 DIAGNOSIS — S46811A Strain of other muscles, fascia and tendons at shoulder and upper arm level, right arm, initial encounter: Secondary | ICD-10-CM | POA: Diagnosis not present

## 2020-09-23 DIAGNOSIS — M25611 Stiffness of right shoulder, not elsewhere classified: Secondary | ICD-10-CM | POA: Diagnosis not present

## 2020-09-25 DIAGNOSIS — M25511 Pain in right shoulder: Secondary | ICD-10-CM | POA: Diagnosis not present

## 2020-09-25 DIAGNOSIS — M25411 Effusion, right shoulder: Secondary | ICD-10-CM | POA: Diagnosis not present

## 2020-09-25 DIAGNOSIS — M25611 Stiffness of right shoulder, not elsewhere classified: Secondary | ICD-10-CM | POA: Diagnosis not present

## 2020-09-25 DIAGNOSIS — M6281 Muscle weakness (generalized): Secondary | ICD-10-CM | POA: Diagnosis not present

## 2020-09-29 DIAGNOSIS — M25611 Stiffness of right shoulder, not elsewhere classified: Secondary | ICD-10-CM | POA: Diagnosis not present

## 2020-09-29 DIAGNOSIS — M545 Low back pain, unspecified: Secondary | ICD-10-CM | POA: Diagnosis not present

## 2020-09-29 DIAGNOSIS — M25511 Pain in right shoulder: Secondary | ICD-10-CM | POA: Diagnosis not present

## 2020-09-29 DIAGNOSIS — M5416 Radiculopathy, lumbar region: Secondary | ICD-10-CM | POA: Diagnosis not present

## 2020-09-29 DIAGNOSIS — M6281 Muscle weakness (generalized): Secondary | ICD-10-CM | POA: Diagnosis not present

## 2020-09-29 DIAGNOSIS — M25411 Effusion, right shoulder: Secondary | ICD-10-CM | POA: Diagnosis not present

## 2020-10-01 DIAGNOSIS — M6281 Muscle weakness (generalized): Secondary | ICD-10-CM | POA: Diagnosis not present

## 2020-10-01 DIAGNOSIS — M25611 Stiffness of right shoulder, not elsewhere classified: Secondary | ICD-10-CM | POA: Diagnosis not present

## 2020-10-01 DIAGNOSIS — M25411 Effusion, right shoulder: Secondary | ICD-10-CM | POA: Diagnosis not present

## 2020-10-01 DIAGNOSIS — M25511 Pain in right shoulder: Secondary | ICD-10-CM | POA: Diagnosis not present

## 2020-10-05 DIAGNOSIS — M25411 Effusion, right shoulder: Secondary | ICD-10-CM | POA: Diagnosis not present

## 2020-10-05 DIAGNOSIS — M25611 Stiffness of right shoulder, not elsewhere classified: Secondary | ICD-10-CM | POA: Diagnosis not present

## 2020-10-05 DIAGNOSIS — M25511 Pain in right shoulder: Secondary | ICD-10-CM | POA: Diagnosis not present

## 2020-10-05 DIAGNOSIS — M6281 Muscle weakness (generalized): Secondary | ICD-10-CM | POA: Diagnosis not present

## 2020-10-09 DIAGNOSIS — M25411 Effusion, right shoulder: Secondary | ICD-10-CM | POA: Diagnosis not present

## 2020-10-09 DIAGNOSIS — M25511 Pain in right shoulder: Secondary | ICD-10-CM | POA: Diagnosis not present

## 2020-10-09 DIAGNOSIS — M25611 Stiffness of right shoulder, not elsewhere classified: Secondary | ICD-10-CM | POA: Diagnosis not present

## 2020-10-09 DIAGNOSIS — M6281 Muscle weakness (generalized): Secondary | ICD-10-CM | POA: Diagnosis not present

## 2020-10-13 DIAGNOSIS — M25611 Stiffness of right shoulder, not elsewhere classified: Secondary | ICD-10-CM | POA: Diagnosis not present

## 2020-10-13 DIAGNOSIS — M25411 Effusion, right shoulder: Secondary | ICD-10-CM | POA: Diagnosis not present

## 2020-10-13 DIAGNOSIS — M6281 Muscle weakness (generalized): Secondary | ICD-10-CM | POA: Diagnosis not present

## 2020-10-13 DIAGNOSIS — M25511 Pain in right shoulder: Secondary | ICD-10-CM | POA: Diagnosis not present

## 2020-10-15 DIAGNOSIS — M25611 Stiffness of right shoulder, not elsewhere classified: Secondary | ICD-10-CM | POA: Diagnosis not present

## 2020-10-15 DIAGNOSIS — M6281 Muscle weakness (generalized): Secondary | ICD-10-CM | POA: Diagnosis not present

## 2020-10-15 DIAGNOSIS — M25511 Pain in right shoulder: Secondary | ICD-10-CM | POA: Diagnosis not present

## 2020-10-15 DIAGNOSIS — M25411 Effusion, right shoulder: Secondary | ICD-10-CM | POA: Diagnosis not present

## 2020-10-19 DIAGNOSIS — M25411 Effusion, right shoulder: Secondary | ICD-10-CM | POA: Diagnosis not present

## 2020-10-19 DIAGNOSIS — M25611 Stiffness of right shoulder, not elsewhere classified: Secondary | ICD-10-CM | POA: Diagnosis not present

## 2020-10-19 DIAGNOSIS — M6281 Muscle weakness (generalized): Secondary | ICD-10-CM | POA: Diagnosis not present

## 2020-10-19 DIAGNOSIS — M25511 Pain in right shoulder: Secondary | ICD-10-CM | POA: Diagnosis not present

## 2020-10-23 DIAGNOSIS — M25611 Stiffness of right shoulder, not elsewhere classified: Secondary | ICD-10-CM | POA: Diagnosis not present

## 2020-10-23 DIAGNOSIS — M25411 Effusion, right shoulder: Secondary | ICD-10-CM | POA: Diagnosis not present

## 2020-10-23 DIAGNOSIS — M6281 Muscle weakness (generalized): Secondary | ICD-10-CM | POA: Diagnosis not present

## 2020-10-23 DIAGNOSIS — M25511 Pain in right shoulder: Secondary | ICD-10-CM | POA: Diagnosis not present

## 2020-10-27 DIAGNOSIS — M25411 Effusion, right shoulder: Secondary | ICD-10-CM | POA: Diagnosis not present

## 2020-10-27 DIAGNOSIS — M25611 Stiffness of right shoulder, not elsewhere classified: Secondary | ICD-10-CM | POA: Diagnosis not present

## 2020-10-27 DIAGNOSIS — M6281 Muscle weakness (generalized): Secondary | ICD-10-CM | POA: Diagnosis not present

## 2020-10-27 DIAGNOSIS — M25511 Pain in right shoulder: Secondary | ICD-10-CM | POA: Diagnosis not present

## 2020-10-29 DIAGNOSIS — M25411 Effusion, right shoulder: Secondary | ICD-10-CM | POA: Diagnosis not present

## 2020-10-29 DIAGNOSIS — M6281 Muscle weakness (generalized): Secondary | ICD-10-CM | POA: Diagnosis not present

## 2020-10-29 DIAGNOSIS — M25511 Pain in right shoulder: Secondary | ICD-10-CM | POA: Diagnosis not present

## 2020-10-29 DIAGNOSIS — M25611 Stiffness of right shoulder, not elsewhere classified: Secondary | ICD-10-CM | POA: Diagnosis not present

## 2020-11-02 DIAGNOSIS — M25411 Effusion, right shoulder: Secondary | ICD-10-CM | POA: Diagnosis not present

## 2020-11-02 DIAGNOSIS — M6281 Muscle weakness (generalized): Secondary | ICD-10-CM | POA: Diagnosis not present

## 2020-11-02 DIAGNOSIS — M25611 Stiffness of right shoulder, not elsewhere classified: Secondary | ICD-10-CM | POA: Diagnosis not present

## 2020-11-02 DIAGNOSIS — M25511 Pain in right shoulder: Secondary | ICD-10-CM | POA: Diagnosis not present

## 2020-11-04 DIAGNOSIS — M25611 Stiffness of right shoulder, not elsewhere classified: Secondary | ICD-10-CM | POA: Diagnosis not present

## 2020-11-04 DIAGNOSIS — S46811A Strain of other muscles, fascia and tendons at shoulder and upper arm level, right arm, initial encounter: Secondary | ICD-10-CM | POA: Diagnosis not present

## 2020-11-10 DIAGNOSIS — M6281 Muscle weakness (generalized): Secondary | ICD-10-CM | POA: Diagnosis not present

## 2020-11-10 DIAGNOSIS — M25511 Pain in right shoulder: Secondary | ICD-10-CM | POA: Diagnosis not present

## 2020-11-10 DIAGNOSIS — M25611 Stiffness of right shoulder, not elsewhere classified: Secondary | ICD-10-CM | POA: Diagnosis not present

## 2020-11-10 DIAGNOSIS — M25411 Effusion, right shoulder: Secondary | ICD-10-CM | POA: Diagnosis not present

## 2020-11-12 DIAGNOSIS — M25511 Pain in right shoulder: Secondary | ICD-10-CM | POA: Diagnosis not present

## 2020-11-12 DIAGNOSIS — M25411 Effusion, right shoulder: Secondary | ICD-10-CM | POA: Diagnosis not present

## 2020-11-12 DIAGNOSIS — M25611 Stiffness of right shoulder, not elsewhere classified: Secondary | ICD-10-CM | POA: Diagnosis not present

## 2020-11-12 DIAGNOSIS — M6281 Muscle weakness (generalized): Secondary | ICD-10-CM | POA: Diagnosis not present

## 2020-11-20 DIAGNOSIS — M6281 Muscle weakness (generalized): Secondary | ICD-10-CM | POA: Diagnosis not present

## 2020-11-20 DIAGNOSIS — M25611 Stiffness of right shoulder, not elsewhere classified: Secondary | ICD-10-CM | POA: Diagnosis not present

## 2020-11-20 DIAGNOSIS — M25411 Effusion, right shoulder: Secondary | ICD-10-CM | POA: Diagnosis not present

## 2020-11-20 DIAGNOSIS — M25511 Pain in right shoulder: Secondary | ICD-10-CM | POA: Diagnosis not present

## 2020-11-26 DIAGNOSIS — M6281 Muscle weakness (generalized): Secondary | ICD-10-CM | POA: Diagnosis not present

## 2020-11-26 DIAGNOSIS — M25411 Effusion, right shoulder: Secondary | ICD-10-CM | POA: Diagnosis not present

## 2020-11-26 DIAGNOSIS — M25611 Stiffness of right shoulder, not elsewhere classified: Secondary | ICD-10-CM | POA: Diagnosis not present

## 2020-11-26 DIAGNOSIS — M25511 Pain in right shoulder: Secondary | ICD-10-CM | POA: Diagnosis not present

## 2020-12-04 DIAGNOSIS — M25511 Pain in right shoulder: Secondary | ICD-10-CM | POA: Diagnosis not present

## 2020-12-04 DIAGNOSIS — M6281 Muscle weakness (generalized): Secondary | ICD-10-CM | POA: Diagnosis not present

## 2020-12-04 DIAGNOSIS — M25611 Stiffness of right shoulder, not elsewhere classified: Secondary | ICD-10-CM | POA: Diagnosis not present

## 2020-12-04 DIAGNOSIS — M25411 Effusion, right shoulder: Secondary | ICD-10-CM | POA: Diagnosis not present

## 2020-12-08 DIAGNOSIS — M25411 Effusion, right shoulder: Secondary | ICD-10-CM | POA: Diagnosis not present

## 2020-12-08 DIAGNOSIS — M6281 Muscle weakness (generalized): Secondary | ICD-10-CM | POA: Diagnosis not present

## 2020-12-08 DIAGNOSIS — M25511 Pain in right shoulder: Secondary | ICD-10-CM | POA: Diagnosis not present

## 2020-12-08 DIAGNOSIS — M25611 Stiffness of right shoulder, not elsewhere classified: Secondary | ICD-10-CM | POA: Diagnosis not present

## 2020-12-18 DIAGNOSIS — M6281 Muscle weakness (generalized): Secondary | ICD-10-CM | POA: Diagnosis not present

## 2020-12-18 DIAGNOSIS — M25611 Stiffness of right shoulder, not elsewhere classified: Secondary | ICD-10-CM | POA: Diagnosis not present

## 2020-12-18 DIAGNOSIS — M25411 Effusion, right shoulder: Secondary | ICD-10-CM | POA: Diagnosis not present

## 2020-12-18 DIAGNOSIS — M25511 Pain in right shoulder: Secondary | ICD-10-CM | POA: Diagnosis not present

## 2021-01-07 DIAGNOSIS — J4520 Mild intermittent asthma, uncomplicated: Secondary | ICD-10-CM | POA: Diagnosis not present

## 2021-01-07 DIAGNOSIS — E785 Hyperlipidemia, unspecified: Secondary | ICD-10-CM | POA: Diagnosis not present

## 2021-01-07 DIAGNOSIS — K219 Gastro-esophageal reflux disease without esophagitis: Secondary | ICD-10-CM | POA: Diagnosis not present

## 2021-01-07 DIAGNOSIS — Z23 Encounter for immunization: Secondary | ICD-10-CM | POA: Diagnosis not present

## 2021-01-07 DIAGNOSIS — Z139 Encounter for screening, unspecified: Secondary | ICD-10-CM | POA: Diagnosis not present

## 2021-01-07 DIAGNOSIS — R809 Proteinuria, unspecified: Secondary | ICD-10-CM | POA: Diagnosis not present

## 2021-01-07 DIAGNOSIS — I10 Essential (primary) hypertension: Secondary | ICD-10-CM | POA: Diagnosis not present

## 2021-01-07 DIAGNOSIS — E1169 Type 2 diabetes mellitus with other specified complication: Secondary | ICD-10-CM | POA: Diagnosis not present

## 2021-01-07 DIAGNOSIS — D649 Anemia, unspecified: Secondary | ICD-10-CM | POA: Diagnosis not present

## 2021-01-07 DIAGNOSIS — K760 Fatty (change of) liver, not elsewhere classified: Secondary | ICD-10-CM | POA: Diagnosis not present

## 2021-01-07 DIAGNOSIS — F430 Acute stress reaction: Secondary | ICD-10-CM | POA: Diagnosis not present

## 2021-01-07 DIAGNOSIS — R609 Edema, unspecified: Secondary | ICD-10-CM | POA: Diagnosis not present

## 2021-01-07 DIAGNOSIS — E1129 Type 2 diabetes mellitus with other diabetic kidney complication: Secondary | ICD-10-CM | POA: Diagnosis not present

## 2021-02-08 DIAGNOSIS — Z0000 Encounter for general adult medical examination without abnormal findings: Secondary | ICD-10-CM | POA: Diagnosis not present

## 2021-02-08 DIAGNOSIS — Z1331 Encounter for screening for depression: Secondary | ICD-10-CM | POA: Diagnosis not present

## 2021-02-08 DIAGNOSIS — Z9181 History of falling: Secondary | ICD-10-CM | POA: Diagnosis not present

## 2021-02-08 DIAGNOSIS — J069 Acute upper respiratory infection, unspecified: Secondary | ICD-10-CM | POA: Diagnosis not present

## 2021-02-08 DIAGNOSIS — E785 Hyperlipidemia, unspecified: Secondary | ICD-10-CM | POA: Diagnosis not present

## 2021-02-16 DIAGNOSIS — H2703 Aphakia, bilateral: Secondary | ICD-10-CM | POA: Diagnosis not present

## 2021-02-16 DIAGNOSIS — E119 Type 2 diabetes mellitus without complications: Secondary | ICD-10-CM | POA: Diagnosis not present

## 2021-03-02 DIAGNOSIS — S46811A Strain of other muscles, fascia and tendons at shoulder and upper arm level, right arm, initial encounter: Secondary | ICD-10-CM | POA: Diagnosis not present

## 2021-05-03 DIAGNOSIS — N3946 Mixed incontinence: Secondary | ICD-10-CM | POA: Diagnosis not present

## 2021-05-03 DIAGNOSIS — N200 Calculus of kidney: Secondary | ICD-10-CM | POA: Diagnosis not present

## 2021-05-06 DIAGNOSIS — Z20822 Contact with and (suspected) exposure to covid-19: Secondary | ICD-10-CM | POA: Diagnosis not present

## 2021-05-13 DIAGNOSIS — M8589 Other specified disorders of bone density and structure, multiple sites: Secondary | ICD-10-CM | POA: Diagnosis not present

## 2021-05-13 DIAGNOSIS — Z1231 Encounter for screening mammogram for malignant neoplasm of breast: Secondary | ICD-10-CM | POA: Diagnosis not present

## 2021-05-17 DIAGNOSIS — I10 Essential (primary) hypertension: Secondary | ICD-10-CM | POA: Diagnosis not present

## 2021-05-17 DIAGNOSIS — D72829 Elevated white blood cell count, unspecified: Secondary | ICD-10-CM | POA: Diagnosis not present

## 2021-05-17 DIAGNOSIS — J4520 Mild intermittent asthma, uncomplicated: Secondary | ICD-10-CM | POA: Diagnosis not present

## 2021-05-17 DIAGNOSIS — K219 Gastro-esophageal reflux disease without esophagitis: Secondary | ICD-10-CM | POA: Diagnosis not present

## 2021-05-17 DIAGNOSIS — E782 Mixed hyperlipidemia: Secondary | ICD-10-CM | POA: Diagnosis not present

## 2021-05-17 DIAGNOSIS — E785 Hyperlipidemia, unspecified: Secondary | ICD-10-CM | POA: Diagnosis not present

## 2021-05-17 DIAGNOSIS — Z6829 Body mass index (BMI) 29.0-29.9, adult: Secondary | ICD-10-CM | POA: Diagnosis not present

## 2021-05-17 DIAGNOSIS — E559 Vitamin D deficiency, unspecified: Secondary | ICD-10-CM | POA: Diagnosis not present

## 2021-05-17 DIAGNOSIS — E1169 Type 2 diabetes mellitus with other specified complication: Secondary | ICD-10-CM | POA: Diagnosis not present

## 2021-05-17 DIAGNOSIS — R609 Edema, unspecified: Secondary | ICD-10-CM | POA: Diagnosis not present

## 2021-05-17 DIAGNOSIS — E1129 Type 2 diabetes mellitus with other diabetic kidney complication: Secondary | ICD-10-CM | POA: Diagnosis not present

## 2021-05-17 DIAGNOSIS — R079 Chest pain, unspecified: Secondary | ICD-10-CM | POA: Diagnosis not present

## 2021-06-02 ENCOUNTER — Encounter: Payer: Self-pay | Admitting: *Deleted

## 2021-06-02 ENCOUNTER — Encounter: Payer: Self-pay | Admitting: Cardiology

## 2021-06-02 DIAGNOSIS — E1169 Type 2 diabetes mellitus with other specified complication: Secondary | ICD-10-CM

## 2021-06-02 DIAGNOSIS — N951 Menopausal and female climacteric states: Secondary | ICD-10-CM | POA: Insufficient documentation

## 2021-06-02 DIAGNOSIS — Z8679 Personal history of other diseases of the circulatory system: Secondary | ICD-10-CM

## 2021-06-02 DIAGNOSIS — R6882 Decreased libido: Secondary | ICD-10-CM

## 2021-06-02 DIAGNOSIS — J45909 Unspecified asthma, uncomplicated: Secondary | ICD-10-CM

## 2021-06-02 DIAGNOSIS — E119 Type 2 diabetes mellitus without complications: Secondary | ICD-10-CM

## 2021-06-02 DIAGNOSIS — L709 Acne, unspecified: Secondary | ICD-10-CM | POA: Insufficient documentation

## 2021-06-02 DIAGNOSIS — E669 Obesity, unspecified: Secondary | ICD-10-CM

## 2021-06-02 HISTORY — DX: Unspecified asthma, uncomplicated: J45.909

## 2021-06-02 HISTORY — DX: Obesity, unspecified: E66.9

## 2021-06-02 HISTORY — DX: Decreased libido: R68.82

## 2021-06-02 HISTORY — DX: Menopausal and female climacteric states: N95.1

## 2021-06-02 HISTORY — DX: Type 2 diabetes mellitus with other specified complication: E11.69

## 2021-06-02 HISTORY — DX: Type 2 diabetes mellitus without complications: E11.9

## 2021-06-02 HISTORY — DX: Acne, unspecified: L70.9

## 2021-06-02 HISTORY — DX: Personal history of other diseases of the circulatory system: Z86.79

## 2021-06-06 DIAGNOSIS — Z20822 Contact with and (suspected) exposure to covid-19: Secondary | ICD-10-CM | POA: Diagnosis not present

## 2021-07-05 DIAGNOSIS — E782 Mixed hyperlipidemia: Secondary | ICD-10-CM | POA: Insufficient documentation

## 2021-07-05 DIAGNOSIS — K760 Fatty (change of) liver, not elsewhere classified: Secondary | ICD-10-CM | POA: Insufficient documentation

## 2021-07-05 DIAGNOSIS — D649 Anemia, unspecified: Secondary | ICD-10-CM | POA: Insufficient documentation

## 2021-07-05 DIAGNOSIS — I10 Essential (primary) hypertension: Secondary | ICD-10-CM | POA: Insufficient documentation

## 2021-07-05 DIAGNOSIS — L719 Rosacea, unspecified: Secondary | ICD-10-CM | POA: Insufficient documentation

## 2021-07-05 DIAGNOSIS — K219 Gastro-esophageal reflux disease without esophagitis: Secondary | ICD-10-CM | POA: Insufficient documentation

## 2021-07-05 DIAGNOSIS — K449 Diaphragmatic hernia without obstruction or gangrene: Secondary | ICD-10-CM | POA: Insufficient documentation

## 2021-07-05 NOTE — Progress Notes (Signed)
?Cardiology Office Note:   ? ?Date:  07/06/2021  ? ?ID:  Susan Gonzales, DOB 12-17-1950, MRN [id number] ? ?PCP:  Vania Rea, MD  ?Cardiologist:  Shirlee More, MD  ? ?Referring MD: Renaldo Reel, PA ? ?ASSESSMENT:   ? ?1. Chest pain of uncertain etiology   ?2. Hypertension, essential, benign   ?3. Mixed hyperlipidemia   ?4. Type 2 diabetes mellitus with hyperlipidemia ([place])   ?5. Chest pain, unspecified type   ? ?PLAN:   ? ?In order of problems listed above: ? ?She is having chest pain possible angina high risk individual will undergo cardiac CTA for diagnosis assessment of severity and guide if she would need angiography and revascularization. ?Stable hypertension at target continue current treatment including ACE inhibitor calcium channel blocker loop diuretic. ?Continue high intensity statin even if her coronary calcium score was 0 with her diabetes ?Stable continue current treatment including cardioprotective agents metformin Januvia. ? ?Next appointment 8 weeks ? ? ?Medication Adjustments/Labs and Tests Ordered: ?Current medicines are reviewed at length with the patient today.  Concerns regarding medicines are outlined above.  ?No orders of the defined types were placed in this encounter. ? ?No orders of the defined types were placed in this encounter. ?  ? ?Chief complaint, for several weeks have been having chest pain ? ?History of Present Illness:   ? ?Susan Gonzales is a 71 y.o. female with a history of hypertension hyperlipidemia type 2 diabetes who is being seen today for the evaluation of chest pain at the request of Renaldo Reel, [place]. ? ?Recent labs 05/17/2021 ?Cholesterol 154 LDL 81 triglycerides 143 HDL 48 creatinine 0.82 GFR 76 cc hemoglobin 13.1 platelets 249,000 ? ?She recognizes me on care of her husband who sounds like he had a severe cardiomyopathy. ?For several weeks she has been having chest pain.  1 component is sharp sternal to the right chest momentary without activity.  The other is more  concerning where she gets a severe aching in both of her shoulders and lasts for few minutes and is relieved with rest but not exertional in nature. ?Pain is not pleuritic she is not diaphoretic or short of breath. ?January she had a fall and head trauma with a tear of the right rotator cuff and avulsion of the biceps muscle she has made a full recovery ?She is a very high risk of diabetes hypertension and hyperlipidemia ?She has no known history of heart disease congenital rheumatic or intracranial fibrillation ?She has chronic edema and takes a diuretic ?She is not short of breath has no orthopnea palpitation or syncope. ?Past Medical History:  ?Diagnosis Date  ? Acne 06/02/2021  ? Anemia   ? Asthma 06/02/2021  ? Diabetes mellitus ([place]) 06/02/2021  ? Fatty liver disease, nonalcoholic   ? Gastro-esophageal reflux disease without esophagitis   ? Hernia, diaphragmatic, without obstruction   ? Hypertension, essential, benign   ? Menopausal syndrome 06/02/2021  ? Mixed hyperlipidemia   ? Obesity 06/02/2021  ? Reduced libido 06/02/2021  ? Rosacea   ? Type 2 diabetes mellitus with hyperlipidemia ([place]) 06/02/2021  ? ? ?Past Surgical History:  ?Procedure Laterality Date  ? CARPAL TUNNEL RELEASE Bilateral   ? CATARACT EXTRACTION Bilateral 2015  ? Dr. Weston Settle in [place]  ? LITHOTRIPSY  10/20/2017  ? 2017 as well  ? ROTATOR CUFF REPAIR Right 05/22/2020  ? [facility]; Dr. Joya Salm  ? SHOULDER SURGERY Left 2015  ? TUBAL LIGATION  1976  ? ? ?  Current Medications: ?Current Meds  ?Medication Sig  ? albuterol (VENTOLIN HFA) 108 (90 Base) MCG/ACT inhaler Inhale 2 puffs into the lungs every 6 (six) hours as needed for wheezing or shortness of breath.  ? amLODipine (NORVASC) 5 MG tablet Take 5 mg by mouth daily.  ? aspirin EC 81 MG tablet Take 81 mg by mouth daily. Swallow whole.  ? benazepril (LOTENSIN) 20 MG tablet Take 20 mg by mouth daily.  ? benzonatate (TESSALON) 200 MG capsule Take 200 mg by mouth 3 (three)  times daily as needed for cough.  ? Biotin 10000 MCG TABS Take 2 tablets by mouth daily.  ? celecoxib (CELEBREX) 200 MG capsule Take 200 mg by mouth in the morning and at bedtime.  ? Cholecalciferol (VITAMIN D3) 50 MCG (2000 UT) TABS Take 1 tablet by mouth daily.  ? clotrimazole-betamethasone (LOTRISONE) cream Apply 1 application. topically 2 (two) times daily.  ? Cyanocobalamin (B-12) 3000 MCG SUBL Place 1 tablet under the tongue daily.  ? ferrous sulfate 325 (65 FE) MG EC tablet Take 1 tablet by mouth daily.  ? fexofenadine (ALLEGRA) 180 MG tablet Take 180 mg by mouth daily.  ? fluticasone-salmeterol (ADVAIR) 250-50 MCG/ACT AEPB Inhale 1 puff into the lungs in the morning and at bedtime.  ? furosemide (LASIX) 20 MG tablet Take 40 mg by mouth daily as needed for fluid or edema.  ? gelatin 650 MG capsule Take 1,300 mg by mouth 2 (two) times daily.  ? Magnesium 400 MG CAPS Take 1 tablet by mouth 2 (two) times daily.  ? metFORMIN (GLUCOPHAGE-XR) 500 MG 24 hr tablet Take 1,000 mg by mouth 2 (two) times daily with a meal.  ? Multiple Vitamins-Minerals (CENTRUM SILVER 50+WOMEN PO) Take 1 tablet by mouth daily.  ? omeprazole (PRILOSEC) 40 MG capsule Take 40 mg by mouth daily.  ? rosuvastatin (CRESTOR) 10 MG tablet Take 10 mg by mouth at bedtime.  ? sitaGLIPtin (JANUVIA) 100 MG tablet Take 100 mg by mouth daily.  ? tamsulosin (FLOMAX) 0.4 MG CAPS capsule Take 0.4 mg by mouth daily.  ?  ? ?Allergies:   Patient has no known allergies.  ? ?Social History  ? ?Socioeconomic History  ? Marital status: Married  ?  Spouse name: Not on file  ? Number of children: Not on file  ? Years of education: Not on file  ? Highest education level: Not on file  ?Occupational History  ? Not on file  ?Tobacco Use  ? Smoking status: Never  ?  Passive exposure: Past  ? Smokeless tobacco: Never  ?Vaping Use  ? Vaping Use: Never used  ?Substance and Sexual Activity  ? Alcohol use: Never  ? Drug use: Never  ? Sexual activity: Not on file  ?Other  Topics Concern  ? Not on file  ?Social History Narrative  ? Not on file  ? ?Social Determinants of Health  ? ?Financial Resource Strain: Not on file  ?Food Insecurity: Not on file  ?Transportation Needs: Not on file  ?Physical Activity: Not on file  ?Stress: Not on file  ?Social Connections: Not on file  ?  ? ?Family History: ?The patient's family history includes Diabetes in her mother; Esophageal cancer in her father; Hypertension in her sister. There is no history of Breast cancer. ? ?ROS:   ?ROS Please see the history of present illness.    ? All other systems reviewed and are negative. ? ?EKGs/Labs/Other Studies Reviewed:   ? ?The following studies were reviewed today: ? ? ?  EKG:  EKG is  ordered today.  The ekg ordered today is personally reviewed and demonstrates sinus rhythm normal EKG ?Recent Labs: ?09/29/2020 creatinine 1.08 potassium 4.4 ? ?Physical Exam:   ? ?VS:  BP 120/70 (BP Location: Right Arm)   Pulse 76   Ht '5\' 3"'$  (1.6 m)   Wt 171 lb 6.4 oz (77.7 kg)   SpO2 97%   BMI 30.36 kg/m?    ? ?Wt Readings from Last 3 Encounters:  ?07/06/21 171 lb 6.4 oz (77.7 kg)  ?05/17/21 169 lb (76.7 kg)  ?  ? ?GEN: She appears her age well nourished, well developed in no acute distress ?HEENT: Normal ?NECK: No JVD; No carotid bruits ?LYMPHATICS: No lymphadenopathy ?CARDIAC: RRR, no murmurs, rubs, gallops ?RESPIRATORY:  Clear to auscultation without rales, wheezing or rhonchi  ?ABDOMEN: Soft, non-tender, non-distended ?MUSCULOSKELETAL:  No edema; No deformity  ?SKIN: Warm and dry ?NEUROLOGIC:  Alert and oriented x 3 ?PSYCHIATRIC:  Normal affect  ? ? ? ?Signed, ?Shirlee More, MD  ?07/06/2021 11:11 AM    ?[place] ?

## 2021-07-06 ENCOUNTER — Encounter: Payer: Self-pay | Admitting: Cardiology

## 2021-07-06 ENCOUNTER — Ambulatory Visit (INDEPENDENT_AMBULATORY_CARE_PROVIDER_SITE_OTHER): Payer: Medicare Other | Admitting: Cardiology

## 2021-07-06 VITALS — BP 120/70 | HR 76 | Ht 63.0 in | Wt 171.4 lb

## 2021-07-06 DIAGNOSIS — E782 Mixed hyperlipidemia: Secondary | ICD-10-CM | POA: Diagnosis not present

## 2021-07-06 DIAGNOSIS — E785 Hyperlipidemia, unspecified: Secondary | ICD-10-CM | POA: Diagnosis not present

## 2021-07-06 DIAGNOSIS — R079 Chest pain, unspecified: Secondary | ICD-10-CM | POA: Diagnosis not present

## 2021-07-06 DIAGNOSIS — I259 Chronic ischemic heart disease, unspecified: Secondary | ICD-10-CM

## 2021-07-06 DIAGNOSIS — E1169 Type 2 diabetes mellitus with other specified complication: Secondary | ICD-10-CM

## 2021-07-06 DIAGNOSIS — I10 Essential (primary) hypertension: Secondary | ICD-10-CM

## 2021-07-06 MED ORDER — METOPROLOL TARTRATE 100 MG PO TABS: 100.0000 mg | ORAL_TABLET | Freq: Once | ORAL | 0 refills | Status: DC

## 2021-07-06 NOTE — Patient Instructions (Signed)
Medication Instructions:  ?Your physician recommends that you continue on your current medications as directed. Please refer to the Current Medication list given to you today. ? ?*If you need a refill on your cardiac medications before your next appointment, please call your pharmacy* ? ? ?Lab Work: ?BMP 1 week before CT ? ?If you have labs (blood work) drawn today and your tests are completely normal, you will receive your results only by: ?MyChart Message (if you have MyChart) OR ?A paper copy in the mail ?If you have any lab test that is abnormal or we need to change your treatment, we will call you to review the results. ? ? ?Testing/Procedures: ? ? ?Your cardiac CT will be scheduled at one of the below locations:  ? ?[facility] ?[address] ?[place], [place] [address] ?[phone] ? ?OR ? ?[facility] ?[address] ?[place], [place] [address] ?[phone] ? ?If scheduled at [facility], please arrive at the [facility] and Children's Entrance (Entrance C2) of [facility] 30 minutes prior to test start time. ?You can use the FREE valet parking offered at entrance C (encouraged to control the heart rate for the test)  ?Proceed to the [facility] Radiology Department (first floor) to check-in and test prep. ? ?All radiology patients and guests should use entrance C2 at [facility], accessed from [facility], even though the hospital's physical address listed is [address] ? ? ? ?If scheduled at [facility], please arrive 15 mins early for check-in and test prep. ? ?Please follow these instructions carefully (unless otherwise directed): ? ?On the Night Before the Test: ?Be sure to Drink plenty of water. ?Do not consume any caffeinated/decaffeinated beverages or chocolate 12 hours prior to your test. ?Do not take any antihistamines 12 hours prior to your test. ? ?On the Day of the  Test: ?Drink plenty of water until 1 hour prior to the test. ?Do not eat any food 4 hours prior to the test. ?You may take your regular medications prior to the test.  ?Take metoprolol (Lopressor) two hours prior to test. ?HOLD Furosemide (Lasix) ?FEMALES- please wear underwire-free bra if available, avoid dresses & tight clothing ?     ?After the Test: ?Drink plenty of water. ?After receiving IV contrast, you may experience a mild flushed feeling. This is normal. ?On occasion, you may experience a mild rash up to 24 hours after the test. This is not dangerous. If this occurs, you can take Benadryl 25 mg and increase your fluid intake. ?If you experience trouble breathing, this can be serious. If it is severe call 911 IMMEDIATELY. If it is mild, please call our office. ?If you take any of these medications: Glipizide/Metformin, Avandament, Glucavance, please do not take 48 hours after completing test unless otherwise instructed. ? ?We will call to schedule your test 2-4 weeks out understanding that some insurance companies will need an authorization prior to the service being performed.  ? ?For non-scheduling related questions, please contact the cardiac imaging nurse navigator should you have any questions/concerns: ?Marchia Bond, Cardiac Imaging Nurse Navigator ?Gordy Clement, Cardiac Imaging Nurse Navigator ?[facility] ?Direct Office Dial: [phone]  ? ?For scheduling needs, including cancellations and rescheduling, please call [place], [phone]. ? ? ? ?Follow-Up: ?At [facility] , you and your health needs are our priority.  As part of our continuing mission to provide you with exceptional heart care,  we have created designated Provider Care Teams.  These Care Teams include your primary Cardiologist (physician) and Advanced Practice Providers (APPs -  Physician Assistants and Nurse Practitioners) who all work together to provide you with the care you need, when you need  it. ? ?We recommend signing up for the patient portal called "MyChart".  Sign up information is provided on this After Visit Summary.  MyChart is used to connect with patients for Virtual Visits (Telemedicine).  Patients are able to view lab/test results, encounter notes, upcoming appointments, etc.  Non-urgent messages can be sent to your provider as well.   ?To learn more about what you can do with MyChart, go to [email or web address].   ? ?Your next appointment:   ?3 month(s) ? ?The format for your next appointment:   ?In Person ? ?Provider:   ?Shirlee More, MD  ? ? ?Other Instructions ?None ? ?Important Information About Sugar ? ? ? ? ? ? ?

## 2021-07-11 DIAGNOSIS — Z20822 Contact with and (suspected) exposure to covid-19: Secondary | ICD-10-CM | POA: Diagnosis not present

## 2021-07-16 DIAGNOSIS — E1169 Type 2 diabetes mellitus with other specified complication: Secondary | ICD-10-CM | POA: Diagnosis not present

## 2021-07-16 DIAGNOSIS — E782 Mixed hyperlipidemia: Secondary | ICD-10-CM | POA: Diagnosis not present

## 2021-07-16 DIAGNOSIS — E785 Hyperlipidemia, unspecified: Secondary | ICD-10-CM | POA: Diagnosis not present

## 2021-07-16 DIAGNOSIS — I10 Essential (primary) hypertension: Secondary | ICD-10-CM | POA: Diagnosis not present

## 2021-07-16 DIAGNOSIS — R079 Chest pain, unspecified: Secondary | ICD-10-CM | POA: Diagnosis not present

## 2021-07-17 LAB — BASIC METABOLIC PANEL
BUN/Creatinine Ratio: 18 (ref 12–28)
BUN: 18 mg/dL (ref 8–27)
CO2: 23 mmol/L (ref 20–29)
Calcium: 10.2 mg/dL (ref 8.7–10.3)
Chloride: 104 mmol/L (ref 96–106)
Creatinine, Ser: 0.98 mg/dL (ref 0.57–1.00)
Glucose: 115 mg/dL — ABNORMAL HIGH (ref 70–99)
Potassium: 4.5 mmol/L (ref 3.5–5.2)
Sodium: 141 mmol/L (ref 134–144)
eGFR: 61 mL/min/{1.73_m2} (ref >59)

## 2021-07-22 DIAGNOSIS — Z20822 Contact with and (suspected) exposure to covid-19: Secondary | ICD-10-CM | POA: Diagnosis not present

## 2021-07-22 DIAGNOSIS — M5416 Radiculopathy, lumbar region: Secondary | ICD-10-CM | POA: Diagnosis not present

## 2021-07-22 DIAGNOSIS — M545 Low back pain, unspecified: Secondary | ICD-10-CM | POA: Diagnosis not present

## 2021-07-23 ENCOUNTER — Telehealth (HOSPITAL_COMMUNITY): Payer: Self-pay | Admitting: Emergency Medicine

## 2021-07-23 NOTE — Telephone Encounter (Signed)
Attempted to call patient regarding upcoming cardiac CT appointment. °Left message on voicemail with name and callback number °Pinky Ravan RN Navigator Cardiac Imaging °[facility] °[phone] Office °[phone] Cell ° °

## 2021-07-26 ENCOUNTER — Ambulatory Visit (HOSPITAL_COMMUNITY)
Admission: RE | Admit: 2021-07-26 | Discharge: 2021-07-26 | Disposition: A | Discharge status: Home / Self Care | Payer: Medicare Other | Source: Ambulatory Visit | Attending: Cardiovascular Disease | Admitting: Cardiovascular Disease

## 2021-07-26 ENCOUNTER — Ambulatory Visit (HOSPITAL_COMMUNITY)
Admission: RE | Admit: 2021-07-26 | Discharge: 2021-07-26 | Disposition: A | Discharge status: Home / Self Care | Payer: Medicare Other | Source: Ambulatory Visit | Attending: Cardiology | Admitting: Cardiology

## 2021-07-26 ENCOUNTER — Other Ambulatory Visit: Payer: Self-pay | Admitting: *Deleted

## 2021-07-26 ENCOUNTER — Other Ambulatory Visit: Payer: Self-pay | Admitting: Cardiovascular Disease

## 2021-07-26 DIAGNOSIS — R931 Abnormal findings on diagnostic imaging of heart and coronary circulation: Secondary | ICD-10-CM

## 2021-07-26 DIAGNOSIS — R079 Chest pain, unspecified: Secondary | ICD-10-CM | POA: Insufficient documentation

## 2021-07-26 DIAGNOSIS — I2510 Atherosclerotic heart disease of native coronary artery without angina pectoris: Secondary | ICD-10-CM | POA: Diagnosis not present

## 2021-07-26 DIAGNOSIS — I259 Chronic ischemic heart disease, unspecified: Secondary | ICD-10-CM | POA: Diagnosis not present

## 2021-07-26 MED ORDER — ISOSORBIDE MONONITRATE ER 30 MG PO TB24: 30.0000 mg | ORAL_TABLET | Freq: Every day | ORAL | 0 refills | Status: DC

## 2021-07-26 MED ORDER — NITROGLYCERIN 0.4 MG SL SUBL
0.8000 mg | SUBLINGUAL_TABLET | Freq: Once | SUBLINGUAL | Status: AC
  Administered 2021-07-26: 0.8 mg via SUBLINGUAL

## 2021-07-26 MED ORDER — NITROGLYCERIN 0.4 MG SL SUBL
SUBLINGUAL_TABLET | SUBLINGUAL | Status: AC
  Filled 2021-07-26: qty 2

## 2021-07-26 MED ORDER — METOPROLOL TARTRATE 5 MG/5ML IV SOLN
INTRAVENOUS | Status: AC
  Administered 2021-07-26: 10 mg via INTRAVENOUS
  Filled 2021-07-26: qty 20

## 2021-07-26 MED ORDER — METOPROLOL TARTRATE 5 MG/5ML IV SOLN
10.0000 mg | INTRAVENOUS | Status: DC | PRN
  Administered 2021-07-26: 10 mg via INTRAVENOUS

## 2021-07-26 MED ORDER — IOHEXOL 350 MG/ML SOLN
95.0000 mL | Freq: Once | INTRAVENOUS | Status: AC | PRN
  Administered 2021-07-26: 95 mL via INTRAVENOUS

## 2021-07-26 NOTE — Progress Notes (Signed)
20g PIV in left AC infiltrated during CT heart scan. Appears to be the saline flush following contrast bolus. Site edematous and tender to touch. Approx 3X2 cm area. Warm compress applied. Ascencion Dike, PA at bedside to evaluate. IV d/c'ed intact. Coban pressure bandage applied. Instructions given to pt by Lennette Bihari with verbal understanding.   ?

## 2021-07-29 ENCOUNTER — Telehealth: Payer: Self-pay | Admitting: Cardiology

## 2021-07-29 NOTE — Telephone Encounter (Signed)
Pt c/o medication issue:  1. Name of Medication: isosorbide mononitrate (IMDUR) 30 MG 24 hr tablet  2. How are you currently taking this medication (dosage and times per day)? As written  3. Are you having a reaction (difficulty breathing--STAT)? Yes   4. What is your medication issue? Patient is having a reaction to medication. Patient is experiencing hives, headache, patient has asthma; scared it may affect breathing

## 2021-07-29 NOTE — Telephone Encounter (Signed)
Left message for patient to call back  

## 2021-07-29 NOTE — Telephone Encounter (Signed)
Pt c/o medication issue:  1. Name of Medication: isosorbide mononitrate (IMDUR) 30 MG 24 hr tablet  2. How are you currently taking this medication (dosage and times per day)? As prescribed   3. Are you having a reaction (difficulty breathing--STAT)? Yes  4. What is your medication issue? Pt states medication seems to be giving a rash from head down to waist. Would like advice as to what to do or if she should only take half the pill.

## 2021-07-30 DIAGNOSIS — L270 Generalized skin eruption due to drugs and medicaments taken internally: Secondary | ICD-10-CM | POA: Diagnosis not present

## 2021-07-30 NOTE — Telephone Encounter (Signed)
Attempted phone call to pt.  Per Epic ok to leave voicemail message.  Pt advised Dr Susan Gonzales is aware of allergic reaction and recommends pt discontinue Imdur.  Please call office for any further questions or concerns.

## 2021-07-30 NOTE — Telephone Encounter (Signed)
See above phone note. Patient has been contacted.

## 2021-07-30 NOTE — Telephone Encounter (Signed)
  Pt is leaving for a hair appt at 2 pm, he said if the nurse can call her back around 4. She said try calling her home phone if she unable to answer then call her cell phone number

## 2021-07-30 NOTE — Telephone Encounter (Signed)
Spoke with pt who reports she began taking Imdur on Tuesday 07/27/2021 and developed a headache and hives on Wednesday.  Pt states this is the only change that she has made.  She did not take Imdur yesterday and has taken Allegra with some improvement in symptoms but still has some hives and pruritis.  Allegra is her daily allergy medication.  She denies any swelling of her lips or tongue or any breathing difficulty.   Advised pt to contact PCP re allergic reaction for further evaluation and will forward to Dr Bettina Gavia for review.  Reviewed ED precautions.  Pt verbalizes understanding and agrees with current plan.

## 2021-07-30 NOTE — Telephone Encounter (Signed)
Pt returning nurses call from yesterday. Please advise 

## 2021-08-03 DIAGNOSIS — M47816 Spondylosis without myelopathy or radiculopathy, lumbar region: Secondary | ICD-10-CM | POA: Diagnosis not present

## 2021-08-03 DIAGNOSIS — L270 Generalized skin eruption due to drugs and medicaments taken internally: Secondary | ICD-10-CM | POA: Diagnosis not present

## 2021-08-03 DIAGNOSIS — M5416 Radiculopathy, lumbar region: Secondary | ICD-10-CM | POA: Diagnosis not present

## 2021-08-03 DIAGNOSIS — M5127 Other intervertebral disc displacement, lumbosacral region: Secondary | ICD-10-CM | POA: Diagnosis not present

## 2021-08-03 DIAGNOSIS — M48061 Spinal stenosis, lumbar region without neurogenic claudication: Secondary | ICD-10-CM | POA: Diagnosis not present

## 2021-08-17 DIAGNOSIS — M545 Low back pain, unspecified: Secondary | ICD-10-CM | POA: Diagnosis not present

## 2021-08-17 DIAGNOSIS — M5416 Radiculopathy, lumbar region: Secondary | ICD-10-CM | POA: Diagnosis not present

## 2021-09-01 NOTE — Progress Notes (Signed)
Cardiology Office Note:    Date:  09/02/2021   ID:  Susan Gonzales, DOB 08-14-1950, MRN [id number]  PCP:  Lowella Dandy, NP  Cardiologist:  Shirlee More, MD    Referring MD: Lowella Dandy, NP    ASSESSMENT:    1. Agatston coronary artery calcium score greater than 400   2. Coronary artery disease of native artery of native heart with stable angina pectoris ([place])   3. Hypertension, essential, benign   4. Type 2 diabetes mellitus with hyperlipidemia ([place])   5. Mixed hyperlipidemia    PLAN:    In order of problems listed above:  Continue her high intensity statin At this time would not advise revascularization without significant left main or severe obstructive CAD with ongoing limiting symptoms despite medical treatment.  She is comfortable with this approach she will continue aspirin and beta-blocker continue calcium channel blocker as well as high intensity statin Stable continue current treatment for hypertension diabetes and lipids   Next appointment: 6 months   Medication Adjustments/Labs and Tests Ordered: Current medicines are reviewed at length with the patient today.  Concerns regarding medicines are outlined above.  No orders of the defined types were placed in this encounter.  No orders of the defined types were placed in this encounter.   Chief complaint follow-up after cardiac CTA  History of Present Illness:    Susan Gonzales is a 71 y.o. female with a hx of anginal chest pain hypertension hyperlipidemia type 2 diabetes last seen 07/06/2021 and referred for cardiac CTA. Compliance with diet, lifestyle and medications: Yes  We had a good opportunity to sit down and review the results of her cardiac CTA I told her she does have CAD Her score is very high and she needs to continue to take a high intensity statin She does not have severe flow-limiting stenosis or left main coronary stenosis and at this point in time with rare exertional chest pain she is best served with  medical treatment I will continue aspirin calcium channel blocker prescription for nitroglycerin if needed and institute beta-blocker therapy.  Following her visit she had a cardiac CTA performed 07/26/2021.  Coronary calcium score was severely elevated 760/94th percentile CAD was present with 50 to 69% calcified proximal stenosis left circumflex 25 to 49% right coronary artery 25 to 49%. FFR was normal left coronary system. Right coronary artery could not be mottled Past Medical History:  Diagnosis Date   Acne 06/02/2021   Anemia    Asthma 06/02/2021   Diabetes mellitus ([place]) 06/02/2021   Fatty liver disease, nonalcoholic    Gastro-esophageal reflux disease without esophagitis    Hernia, diaphragmatic, without obstruction    Hypertension, essential, benign    Menopausal syndrome 06/02/2021   Mixed hyperlipidemia    Obesity 06/02/2021   Reduced libido 06/02/2021   Rosacea    Type 2 diabetes mellitus with hyperlipidemia ([place]) 06/02/2021    Past Surgical History:  Procedure Laterality Date   CARPAL TUNNEL RELEASE Bilateral    CATARACT EXTRACTION Bilateral 2015   Dr. Weston Settle in [place]   LITHOTRIPSY  10/20/2017   2017 as well   ROTATOR CUFF REPAIR Right 05/22/2020   [facility]; Dr. Joya Salm   SHOULDER SURGERY Left 2015   [place]    Current Medications: Current Meds  Medication Sig   albuterol (VENTOLIN HFA) 108 (90 Base) MCG/ACT inhaler Inhale 2 puffs into the lungs every 6 (six) hours as needed for wheezing  or shortness of breath.   amLODipine (NORVASC) 5 MG tablet Take 5 mg by mouth daily.   aspirin EC 81 MG tablet Take 81 mg by mouth daily. Swallow whole.   benazepril (LOTENSIN) 20 MG tablet Take 20 mg by mouth daily.   benzonatate (TESSALON) 200 MG capsule Take 200 mg by mouth 3 (three) times daily as needed for cough.   Biotin 10000 MCG TABS Take 2 tablets by mouth daily.   celecoxib (CELEBREX) 200 MG capsule Take 200 mg by mouth in the  morning and at bedtime.   Cholecalciferol (VITAMIN D3) 50 MCG (2000 UT) TABS Take 1 tablet by mouth daily.   clotrimazole-betamethasone (LOTRISONE) cream Apply 1 application. topically 2 (two) times daily.   Cyanocobalamin (B-12) 3000 MCG SUBL Place 1 tablet under the tongue daily.   ferrous sulfate 325 (65 FE) MG EC tablet Take 1 tablet by mouth daily.   fexofenadine (ALLEGRA) 180 MG tablet Take 180 mg by mouth daily.   fluticasone-salmeterol (ADVAIR) 250-50 MCG/ACT AEPB Inhale 1 puff into the lungs in the morning and at bedtime.   furosemide (LASIX) 20 MG tablet Take 40 mg by mouth daily as needed for fluid or edema.   gelatin 650 MG capsule Take 1,300 mg by mouth 2 (two) times daily.   Magnesium 400 MG CAPS Take 1 tablet by mouth 2 (two) times daily.   metFORMIN (GLUCOPHAGE-XR) 500 MG 24 hr tablet Take 1,000 mg by mouth 2 (two) times daily with a meal.   Multiple Vitamins-Minerals (CENTRUM SILVER 50+WOMEN PO) Take 1 tablet by mouth daily.   omeprazole (PRILOSEC) 40 MG capsule Take 40 mg by mouth daily.   rosuvastatin (CRESTOR) 10 MG tablet Take 10 mg by mouth at bedtime.   sitaGLIPtin (JANUVIA) 100 MG tablet Take 100 mg by mouth daily.   tamsulosin (FLOMAX) 0.4 MG CAPS capsule Take 0.4 mg by mouth daily.     Allergies:   Imdur [isosorbide nitrate]   Social History   Socioeconomic History   Marital status: Married    Spouse name: Not on file   Number of children: Not on file   Years of education: Not on file   Highest education level: Not on file  Occupational History   Not on file  Tobacco Use   Smoking status: Never    Passive exposure: Past   Smokeless tobacco: Never  Vaping Use   Vaping Use: Never used  Substance and Sexual Activity   Alcohol use: Never   Drug use: Never   Sexual activity: Not on file  Other Topics Concern   Not on file  Social History Narrative   Not on file   Social Determinants of Health   Financial Resource Strain: Not on file  Food  Insecurity: Not on file  Transportation Needs: Not on file  Physical Activity: Not on file  Stress: Not on file  Social Connections: Not on file     Family History: The patient's family history includes Diabetes in her mother; Esophageal cancer in her father; Hypertension in her sister. There is no history of Breast cancer. ROS:   Please see the history of present illness.    All other systems reviewed and are negative.  EKGs/Labs/Other Studies Reviewed:    The following studies were reviewed today:   Recent Labs: 07/16/2021: BUN 18; Creatinine, Ser 0.98; Potassium 4.5; Sodium 141  Recent Lipid Panel 05/17/2021 total cholesterol 154 LDL 81 A1c 6.9%  Physical Exam:    VS:  BP 126/72 (  BP Location: Left Arm, Patient Position: Sitting, Cuff Size: Normal)   Pulse 98   Ht '5\' 3"'$  (1.6 m)   Wt 168 lb (76.2 kg)   SpO2 98%   BMI 29.76 kg/m     Wt Readings from Last 3 Encounters:  09/02/21 168 lb (76.2 kg)  07/06/21 171 lb 6.4 oz (77.7 kg)  05/17/21 169 lb (76.7 kg)     GEN:  Well nourished, well developed in no acute distress HEENT: Normal NECK: No JVD; No carotid bruits LYMPHATICS: No lymphadenopathy CARDIAC: RRR, no murmurs, rubs, gallops RESPIRATORY:  Clear to auscultation without rales, wheezing or rhonchi  ABDOMEN: Soft, non-tender, non-distended MUSCULOSKELETAL:  No edema; No deformity  SKIN: Warm and dry NEUROLOGIC:  Alert and oriented x 3 PSYCHIATRIC:  Normal affect    Signed, Shirlee More, MD  09/02/2021 10:40 AM    [place]

## 2021-09-02 ENCOUNTER — Encounter: Payer: Self-pay | Admitting: Cardiology

## 2021-09-02 ENCOUNTER — Other Ambulatory Visit: Payer: Self-pay

## 2021-09-02 ENCOUNTER — Ambulatory Visit (INDEPENDENT_AMBULATORY_CARE_PROVIDER_SITE_OTHER): Payer: Medicare Other | Admitting: Cardiology

## 2021-09-02 ENCOUNTER — Telehealth: Payer: Self-pay | Admitting: Cardiology

## 2021-09-02 VITALS — BP 126/72 | HR 98 | Ht 63.0 in | Wt 168.0 lb

## 2021-09-02 DIAGNOSIS — E782 Mixed hyperlipidemia: Secondary | ICD-10-CM

## 2021-09-02 DIAGNOSIS — R931 Abnormal findings on diagnostic imaging of heart and coronary circulation: Secondary | ICD-10-CM

## 2021-09-02 DIAGNOSIS — I10 Essential (primary) hypertension: Secondary | ICD-10-CM

## 2021-09-02 DIAGNOSIS — I25118 Atherosclerotic heart disease of native coronary artery with other forms of angina pectoris: Secondary | ICD-10-CM | POA: Diagnosis not present

## 2021-09-02 DIAGNOSIS — E785 Hyperlipidemia, unspecified: Secondary | ICD-10-CM | POA: Diagnosis not present

## 2021-09-02 DIAGNOSIS — N200 Calculus of kidney: Secondary | ICD-10-CM

## 2021-09-02 DIAGNOSIS — E1169 Type 2 diabetes mellitus with other specified complication: Secondary | ICD-10-CM

## 2021-09-02 HISTORY — DX: Calculus of kidney: N20.0

## 2021-09-02 MED ORDER — METOPROLOL TARTRATE 25 MG PO TABS: 12.5000 mg | ORAL_TABLET | Freq: Two times a day (BID) | ORAL | 3 refills | Status: DC

## 2021-09-02 MED ORDER — NITROGLYCERIN 0.4 MG SL SUBL: 0.4000 mg | SUBLINGUAL_TABLET | SUBLINGUAL | 1 refills | Status: AC | PRN

## 2021-09-02 NOTE — Telephone Encounter (Signed)
Spoke with Dr. Bettina Gavia about this question and he stated that it was ok for her to take her metoprolol with her other blood pressure medication. The patient was informed of this and she had no further questions at this time.

## 2021-09-02 NOTE — Patient Instructions (Addendum)
Medication Instructions:  Your physician has recommended you make the following change in your medication:   START: Metoprolol tartrate 12.5 mg twice daily START: Nitroglycerin 0.4 mg under the tongue every 5 minutes as needed for chest pain  *If you need a refill on your cardiac medications before your next appointment, please call your pharmacy*   Lab Work: None If you have labs (blood work) drawn today and your tests are completely normal, you will receive your results only by: [place] (if you have MyChart) OR A paper copy in the mail If you have any lab test that is abnormal or we need to change your treatment, we will call you to review the results.   Testing/Procedures: None   Follow-Up: At [facility], you and your health needs are our priority.  As part of our continuing mission to provide you with exceptional heart care, we have created designated Provider Care Teams.  These Care Teams include your primary Cardiologist (physician) and Advanced Practice Providers (APPs -  Physician Assistants and Nurse Practitioners) who all work together to provide you with the care you need, when you need it.  We recommend signing up for the patient portal called "MyChart".  Sign up information is provided on this After Visit Summary.  MyChart is used to connect with patients for Virtual Visits (Telemedicine).  Patients are able to view lab/test results, encounter notes, upcoming appointments, etc.  Non-urgent messages can be sent to your provider as well.   To learn more about what you can do with MyChart, go to [email or web address].    Your next appointment:   6 month(s)  The format for your next appointment:   In Person  Provider:   Shirlee More, MD    Other Instructions None  Important Information About Sugar

## 2021-09-02 NOTE — Telephone Encounter (Signed)
Pt c/o medication issue:  1. Name of Medication: amLODipine (NORVASC) 5 MG tablet benazepril (LOTENSIN) 20 MG tablet metoprolol tartrate (LOPRESSOR) 25 MG tablet  2. How are you currently taking this medication (dosage and times per day)? As written  3. Are you having a reaction (difficulty breathing--STAT)? no  4. What is your medication issue? Pt states that she was just prescribed metoprolol today and wants to know is it okay for her to take this medication with amlodipine and benzapril. Please advise.

## 2021-09-24 DIAGNOSIS — J4520 Mild intermittent asthma, uncomplicated: Secondary | ICD-10-CM | POA: Diagnosis not present

## 2021-09-24 DIAGNOSIS — E785 Hyperlipidemia, unspecified: Secondary | ICD-10-CM | POA: Diagnosis not present

## 2021-09-24 DIAGNOSIS — I10 Essential (primary) hypertension: Secondary | ICD-10-CM | POA: Diagnosis not present

## 2021-09-24 DIAGNOSIS — E782 Mixed hyperlipidemia: Secondary | ICD-10-CM | POA: Diagnosis not present

## 2021-09-24 DIAGNOSIS — D72829 Elevated white blood cell count, unspecified: Secondary | ICD-10-CM | POA: Diagnosis not present

## 2021-09-24 DIAGNOSIS — R609 Edema, unspecified: Secondary | ICD-10-CM | POA: Diagnosis not present

## 2021-09-24 DIAGNOSIS — M8580 Other specified disorders of bone density and structure, unspecified site: Secondary | ICD-10-CM | POA: Diagnosis not present

## 2021-09-24 DIAGNOSIS — E559 Vitamin D deficiency, unspecified: Secondary | ICD-10-CM | POA: Diagnosis not present

## 2021-09-24 DIAGNOSIS — E1169 Type 2 diabetes mellitus with other specified complication: Secondary | ICD-10-CM | POA: Diagnosis not present

## 2021-09-24 DIAGNOSIS — K760 Fatty (change of) liver, not elsewhere classified: Secondary | ICD-10-CM | POA: Diagnosis not present

## 2021-09-24 DIAGNOSIS — K219 Gastro-esophageal reflux disease without esophagitis: Secondary | ICD-10-CM | POA: Diagnosis not present

## 2021-09-25 DIAGNOSIS — R601 Generalized edema: Secondary | ICD-10-CM | POA: Diagnosis not present

## 2021-09-25 DIAGNOSIS — R600 Localized edema: Secondary | ICD-10-CM | POA: Diagnosis not present

## 2021-09-25 DIAGNOSIS — L03115 Cellulitis of right lower limb: Secondary | ICD-10-CM | POA: Diagnosis not present

## 2021-09-25 DIAGNOSIS — R6510 Systemic inflammatory response syndrome (SIRS) of non-infectious origin without acute organ dysfunction: Secondary | ICD-10-CM | POA: Diagnosis not present

## 2021-09-25 DIAGNOSIS — S91001A Unspecified open wound, right ankle, initial encounter: Secondary | ICD-10-CM | POA: Diagnosis not present

## 2021-09-25 DIAGNOSIS — M7731 Calcaneal spur, right foot: Secondary | ICD-10-CM | POA: Diagnosis not present

## 2021-09-25 DIAGNOSIS — R509 Fever, unspecified: Secondary | ICD-10-CM | POA: Diagnosis not present

## 2021-09-25 DIAGNOSIS — M7989 Other specified soft tissue disorders: Secondary | ICD-10-CM | POA: Diagnosis not present

## 2021-09-26 DIAGNOSIS — Z23 Encounter for immunization: Secondary | ICD-10-CM | POA: Diagnosis not present

## 2021-09-26 DIAGNOSIS — R600 Localized edema: Secondary | ICD-10-CM | POA: Diagnosis not present

## 2021-09-26 DIAGNOSIS — L03115 Cellulitis of right lower limb: Secondary | ICD-10-CM | POA: Diagnosis not present

## 2021-09-26 DIAGNOSIS — R509 Fever, unspecified: Secondary | ICD-10-CM | POA: Diagnosis not present

## 2021-09-26 DIAGNOSIS — M7989 Other specified soft tissue disorders: Secondary | ICD-10-CM | POA: Diagnosis not present

## 2021-09-26 DIAGNOSIS — R6510 Systemic inflammatory response syndrome (SIRS) of non-infectious origin without acute organ dysfunction: Secondary | ICD-10-CM | POA: Diagnosis not present

## 2021-09-26 DIAGNOSIS — M1990 Unspecified osteoarthritis, unspecified site: Secondary | ICD-10-CM | POA: Diagnosis not present

## 2021-09-26 DIAGNOSIS — J45909 Unspecified asthma, uncomplicated: Secondary | ICD-10-CM | POA: Diagnosis not present

## 2021-09-26 DIAGNOSIS — A280 Pasteurellosis: Secondary | ICD-10-CM | POA: Diagnosis not present

## 2021-09-26 DIAGNOSIS — M7731 Calcaneal spur, right foot: Secondary | ICD-10-CM | POA: Diagnosis not present

## 2021-09-26 DIAGNOSIS — E785 Hyperlipidemia, unspecified: Secondary | ICD-10-CM | POA: Diagnosis not present

## 2021-09-26 DIAGNOSIS — Z6830 Body mass index (BMI) 30.0-30.9, adult: Secondary | ICD-10-CM | POA: Diagnosis not present

## 2021-09-26 DIAGNOSIS — E669 Obesity, unspecified: Secondary | ICD-10-CM | POA: Diagnosis not present

## 2021-09-26 DIAGNOSIS — M25571 Pain in right ankle and joints of right foot: Secondary | ICD-10-CM | POA: Diagnosis not present

## 2021-09-26 DIAGNOSIS — R601 Generalized edema: Secondary | ICD-10-CM | POA: Diagnosis not present

## 2021-09-26 DIAGNOSIS — D509 Iron deficiency anemia, unspecified: Secondary | ICD-10-CM | POA: Diagnosis not present

## 2021-09-26 DIAGNOSIS — Z7984 Long term (current) use of oral hypoglycemic drugs: Secondary | ICD-10-CM | POA: Diagnosis not present

## 2021-09-26 DIAGNOSIS — Z87440 Personal history of urinary (tract) infections: Secondary | ICD-10-CM | POA: Diagnosis not present

## 2021-09-26 DIAGNOSIS — S91052A Open bite, left ankle, initial encounter: Secondary | ICD-10-CM | POA: Diagnosis not present

## 2021-09-26 DIAGNOSIS — E119 Type 2 diabetes mellitus without complications: Secondary | ICD-10-CM | POA: Diagnosis not present

## 2021-09-26 DIAGNOSIS — S91001A Unspecified open wound, right ankle, initial encounter: Secondary | ICD-10-CM | POA: Diagnosis not present

## 2021-09-26 DIAGNOSIS — R300 Dysuria: Secondary | ICD-10-CM | POA: Diagnosis not present

## 2021-09-26 DIAGNOSIS — Z792 Long term (current) use of antibiotics: Secondary | ICD-10-CM | POA: Diagnosis not present

## 2021-09-26 DIAGNOSIS — Z7982 Long term (current) use of aspirin: Secondary | ICD-10-CM | POA: Diagnosis not present

## 2021-09-26 DIAGNOSIS — I10 Essential (primary) hypertension: Secondary | ICD-10-CM | POA: Diagnosis not present

## 2021-09-30 DIAGNOSIS — Z2914 Encounter for prophylactic rabies immune globin: Secondary | ICD-10-CM | POA: Diagnosis not present

## 2021-09-30 DIAGNOSIS — Z23 Encounter for immunization: Secondary | ICD-10-CM | POA: Diagnosis not present

## 2021-09-30 DIAGNOSIS — Z203 Contact with and (suspected) exposure to rabies: Secondary | ICD-10-CM | POA: Diagnosis not present

## 2021-10-04 DIAGNOSIS — W5501XD Bitten by cat, subsequent encounter: Secondary | ICD-10-CM | POA: Diagnosis not present

## 2021-10-04 DIAGNOSIS — Z203 Contact with and (suspected) exposure to rabies: Secondary | ICD-10-CM | POA: Diagnosis not present

## 2021-10-04 DIAGNOSIS — Z79899 Other long term (current) drug therapy: Secondary | ICD-10-CM | POA: Diagnosis not present

## 2021-10-04 DIAGNOSIS — Z23 Encounter for immunization: Secondary | ICD-10-CM | POA: Diagnosis not present

## 2021-10-04 DIAGNOSIS — Z2914 Encounter for prophylactic rabies immune globin: Secondary | ICD-10-CM | POA: Diagnosis not present

## 2021-10-04 DIAGNOSIS — L03115 Cellulitis of right lower limb: Secondary | ICD-10-CM | POA: Diagnosis not present

## 2021-10-12 ENCOUNTER — Ambulatory Visit: Payer: Medicare Other | Admitting: Cardiology

## 2021-10-12 DIAGNOSIS — Z2914 Encounter for prophylactic rabies immune globin: Secondary | ICD-10-CM | POA: Diagnosis not present

## 2021-10-12 DIAGNOSIS — Z203 Contact with and (suspected) exposure to rabies: Secondary | ICD-10-CM | POA: Diagnosis not present

## 2021-10-12 DIAGNOSIS — W5501XD Bitten by cat, subsequent encounter: Secondary | ICD-10-CM | POA: Diagnosis not present

## 2021-10-12 DIAGNOSIS — Z23 Encounter for immunization: Secondary | ICD-10-CM | POA: Diagnosis not present

## 2021-10-12 DIAGNOSIS — L03115 Cellulitis of right lower limb: Secondary | ICD-10-CM | POA: Diagnosis not present

## 2021-12-05 DIAGNOSIS — R509 Fever, unspecified: Secondary | ICD-10-CM | POA: Diagnosis not present

## 2021-12-05 DIAGNOSIS — J111 Influenza due to unidentified influenza virus with other respiratory manifestations: Secondary | ICD-10-CM | POA: Diagnosis not present

## 2021-12-05 DIAGNOSIS — M791 Myalgia, unspecified site: Secondary | ICD-10-CM | POA: Diagnosis not present

## 2021-12-06 DIAGNOSIS — L03115 Cellulitis of right lower limb: Secondary | ICD-10-CM | POA: Diagnosis not present

## 2021-12-06 DIAGNOSIS — M25562 Pain in left knee: Secondary | ICD-10-CM | POA: Diagnosis not present

## 2021-12-06 DIAGNOSIS — J111 Influenza due to unidentified influenza virus with other respiratory manifestations: Secondary | ICD-10-CM | POA: Diagnosis not present

## 2021-12-06 DIAGNOSIS — L03116 Cellulitis of left lower limb: Secondary | ICD-10-CM | POA: Diagnosis not present

## 2022-01-10 DIAGNOSIS — M1712 Unilateral primary osteoarthritis, left knee: Secondary | ICD-10-CM | POA: Diagnosis not present

## 2022-01-26 DIAGNOSIS — M47817 Spondylosis without myelopathy or radiculopathy, lumbosacral region: Secondary | ICD-10-CM | POA: Diagnosis not present

## 2022-01-26 DIAGNOSIS — M5136 Other intervertebral disc degeneration, lumbar region: Secondary | ICD-10-CM | POA: Diagnosis not present

## 2022-01-28 DIAGNOSIS — E559 Vitamin D deficiency, unspecified: Secondary | ICD-10-CM | POA: Diagnosis not present

## 2022-01-28 DIAGNOSIS — Z139 Encounter for screening, unspecified: Secondary | ICD-10-CM | POA: Diagnosis not present

## 2022-01-28 DIAGNOSIS — R609 Edema, unspecified: Secondary | ICD-10-CM | POA: Diagnosis not present

## 2022-01-28 DIAGNOSIS — E1169 Type 2 diabetes mellitus with other specified complication: Secondary | ICD-10-CM | POA: Diagnosis not present

## 2022-01-28 DIAGNOSIS — E782 Mixed hyperlipidemia: Secondary | ICD-10-CM | POA: Diagnosis not present

## 2022-01-28 DIAGNOSIS — E785 Hyperlipidemia, unspecified: Secondary | ICD-10-CM | POA: Diagnosis not present

## 2022-01-28 DIAGNOSIS — I10 Essential (primary) hypertension: Secondary | ICD-10-CM | POA: Diagnosis not present

## 2022-01-28 DIAGNOSIS — Z23 Encounter for immunization: Secondary | ICD-10-CM | POA: Diagnosis not present

## 2022-01-28 DIAGNOSIS — J4520 Mild intermittent asthma, uncomplicated: Secondary | ICD-10-CM | POA: Diagnosis not present

## 2022-01-28 DIAGNOSIS — K219 Gastro-esophageal reflux disease without esophagitis: Secondary | ICD-10-CM | POA: Diagnosis not present

## 2022-01-28 DIAGNOSIS — D72829 Elevated white blood cell count, unspecified: Secondary | ICD-10-CM | POA: Diagnosis not present

## 2022-02-01 DIAGNOSIS — M1711 Unilateral primary osteoarthritis, right knee: Secondary | ICD-10-CM | POA: Diagnosis not present

## 2022-03-03 ENCOUNTER — Ambulatory Visit: Payer: Medicare Other | Attending: Cardiology | Admitting: Cardiology

## 2022-03-03 ENCOUNTER — Encounter: Payer: Self-pay | Admitting: Cardiology

## 2022-03-03 VITALS — BP 99/45 | HR 84 | Ht 64.6 in | Wt 169.2 lb

## 2022-03-03 DIAGNOSIS — E782 Mixed hyperlipidemia: Secondary | ICD-10-CM | POA: Diagnosis not present

## 2022-03-03 DIAGNOSIS — E1169 Type 2 diabetes mellitus with other specified complication: Secondary | ICD-10-CM | POA: Insufficient documentation

## 2022-03-03 DIAGNOSIS — I10 Essential (primary) hypertension: Secondary | ICD-10-CM | POA: Diagnosis not present

## 2022-03-03 DIAGNOSIS — R931 Abnormal findings on diagnostic imaging of heart and coronary circulation: Secondary | ICD-10-CM | POA: Insufficient documentation

## 2022-03-03 DIAGNOSIS — E785 Hyperlipidemia, unspecified: Secondary | ICD-10-CM | POA: Diagnosis not present

## 2022-03-03 DIAGNOSIS — I25118 Atherosclerotic heart disease of native coronary artery with other forms of angina pectoris: Secondary | ICD-10-CM | POA: Diagnosis not present

## 2022-03-03 NOTE — Patient Instructions (Signed)
Medication Instructions:  Your physician recommends that you continue on your current medications as directed. Please refer to the Current Medication list given to you today.  *If you need a refill on your cardiac medications before your next appointment, please call your pharmacy*   Lab Work: NONE If you have labs (blood work) drawn today and your tests are completely normal, you will receive your results only by: MyChart Message (if you have MyChart) OR A paper copy in the mail If you have any lab test that is abnormal or we need to change your treatment, we will call you to review the results.   Testing/Procedures: NONE   Follow-Up: At [facility], you and your health needs are our priority.  As part of our continuing mission to provide you with exceptional heart care, we have created designated Provider Care Teams.  These Care Teams include your primary Cardiologist (physician) and Advanced Practice Providers (APPs -  Physician Assistants and Nurse Practitioners) who all work together to provide you with the care you need, when you need it.  We recommend signing up for the patient portal called "MyChart".  Sign up information is provided on this After Visit Summary.  MyChart is used to connect with patients for Virtual Visits (Telemedicine).  Patients are able to view lab/test results, encounter notes, upcoming appointments, etc.  Non-urgent messages can be sent to your provider as well.   To learn more about what you can do with MyChart, go to [email or web address].    Your next appointment:   1 year(s)  The format for your next appointment:   In Person  Provider:   Brian Munley, MD    Other Instructions   Important Information About Sugar       

## 2022-03-03 NOTE — Progress Notes (Signed)
Scheduled 2 days ago and I put her back in my schedule she did not show Cardiology Office Note:    Date:  03/03/2022   ID:  Susan Gonzales, DOB [birth year], MRN [id number]  PCP:  Lowella Dandy, NP  Cardiologist:  Shirlee More, MD    Referring MD: Lowella Dandy, NP    ASSESSMENT:    1. Coronary artery disease of native artery of native heart with stable angina pectoris ([place])   2. Agatston coronary artery calcium score greater than 400   3. Hypertension, essential, benign   4. Type 2 diabetes mellitus with hyperlipidemia ([place])   5. Mixed hyperlipidemia    PLAN:    In order of problems listed above:  [name] is doing well stable CAD on good medical therapy including aspirin high intensity statin beta-blocker and antihypertensives and is having no angina.  Continue medical treatment.  I would not advise invasive cardiac procedures and intervention especially if she does not have severe flow-limiting stenosis on CTA I suspect her blood pressure is well-controlled she forgot to bring a list with her and asked in the future to bring it to the office because of the wide variation in office-based blood pressures and continue her current antihypertensives including diuretic beta-blocker and calcium channel blocker Lipids that are at target on high intensity statin and continue rosuvastatin Diabetes managed with her PCP   Next appointment: I will plan to see her back in the office in 1 year   Medication Adjustments/Labs and Tests Ordered: Current medicines are reviewed at length with the patient today.  Concerns regarding medicines are outlined above.  No orders of the defined types were placed in this encounter.  No orders of the defined types were placed in this encounter.   Chief Complaint  Patient presents with   Follow-up   Coronary Artery Disease    History of Present Illness:    Susan Gonzales is a 71 y.o. female with a hx of anginal chest pain CAD with a coronary CTA 07/26/2021 showing  a score severely elevated 760/94th percentile 50 to 60% proximal left anterior descending coronary artery and less than 50% stenosis left circumflex and right coronary artery coronary artery hypertension hyperlipidemia and type 2 diabetes last seen 09/02/2021. Compliance with diet, lifestyle and medications: Yes  She has done well has not had any anginal discomfort at home blood pressure runs between 027 253 systolic and tolerates her lipid-lowering therapy without any muscle pain or weakness. Recent labs are reassuring 01/28/2022 cholesterol 168 LDL 92 A1c 7.3 hemoglobin 12.2 creatinine 0.8 potassium 4.7 Past Medical History:  Diagnosis Date   Acne 06/02/2021   Anemia    Asthma 06/02/2021   Diabetes mellitus ([place]) 06/02/2021   Fatty liver disease, nonalcoholic    Gastro-esophageal reflux disease without esophagitis    Hernia, diaphragmatic, without obstruction    Hypertension, essential, benign    Menopausal syndrome 06/02/2021   Mixed hyperlipidemia    Obesity 06/02/2021   Reduced libido 06/02/2021   Rosacea    Type 2 diabetes mellitus with hyperlipidemia ([place]) 06/02/2021    Past Surgical History:  Procedure Laterality Date   CARPAL TUNNEL RELEASE Bilateral    CATARACT EXTRACTION Bilateral 2015   Dr. Weston Settle in [place]   LITHOTRIPSY  10/20/2017   2017 as well   ROTATOR CUFF REPAIR Right 05/22/2020   [facility]; Dr. Joya Salm   SHOULDER SURGERY Left 2015   [place]    Current Medications:  Current Meds  Medication Sig   albuterol (VENTOLIN HFA) 108 (90 Base) MCG/ACT inhaler Inhale 2 puffs into the lungs every 6 (six) hours as needed for wheezing or shortness of breath.   amLODipine (NORVASC) 5 MG tablet Take 5 mg by mouth daily.   aspirin EC 81 MG tablet Take 81 mg by mouth daily. Swallow whole.   benazepril (LOTENSIN) 20 MG tablet Take 20 mg by mouth daily.   benzonatate (TESSALON) 200 MG capsule Take 200 mg by mouth 3 (three) times daily as  needed for cough.   Biotin 10000 MCG TABS Take 2 tablets by mouth daily.   celecoxib (CELEBREX) 200 MG capsule Take 200 mg by mouth in the morning and at bedtime.   Cholecalciferol (VITAMIN D3) 50 MCG (2000 UT) TABS Take 1 tablet by mouth daily.   clotrimazole-betamethasone (LOTRISONE) cream Apply 1 application  topically as needed (rash).   Cyanocobalamin (B-12) 3000 MCG SUBL Place 1 tablet under the tongue daily.   ferrous sulfate 325 (65 FE) MG EC tablet Take 1 tablet by mouth daily.   fexofenadine (ALLEGRA) 180 MG tablet Take 180 mg by mouth daily.   fluticasone-salmeterol (ADVAIR) 250-50 MCG/ACT AEPB Inhale 1 puff into the lungs in the morning and at bedtime.   furosemide (LASIX) 20 MG tablet Take 40 mg by mouth daily as needed for fluid or edema.   gelatin 650 MG capsule Take 1,300 mg by mouth 2 (two) times daily.   Magnesium 400 MG CAPS Take 1 tablet by mouth 2 (two) times daily.   metFORMIN (GLUCOPHAGE-XR) 500 MG 24 hr tablet Take 1,000 mg by mouth 2 (two) times daily with a meal.   metoprolol tartrate (LOPRESSOR) 25 MG tablet Take 0.5 tablets (12.5 mg total) by mouth 2 (two) times daily.   Multiple Vitamins-Minerals (CENTRUM SILVER 50+WOMEN PO) Take 1 tablet by mouth daily.   nitroGLYCERIN (NITROSTAT) 0.4 MG SL tablet Place 1 tablet (0.4 mg total) under the tongue every 5 (five) minutes as needed for chest pain.   omeprazole (PRILOSEC) 40 MG capsule Take 40 mg by mouth daily.   rosuvastatin (CRESTOR) 10 MG tablet Take 10 mg by mouth at bedtime.   sitaGLIPtin (JANUVIA) 100 MG tablet Take 100 mg by mouth daily.   tamsulosin (FLOMAX) 0.4 MG CAPS capsule Take 0.4 mg by mouth daily.     Allergies:   Imdur [isosorbide nitrate]   Social History   Socioeconomic History   Marital status: Married    Spouse name: Not on file   Number of children: Not on file   Years of education: Not on file   Highest education level: Not on file  Occupational History   Not on file  Tobacco Use    Smoking status: Never    Passive exposure: Past   Smokeless tobacco: Never  Vaping Use   Vaping Use: Never used  Substance and Sexual Activity   Alcohol use: Never   Drug use: Never   Sexual activity: Not on file  Other Topics Concern   Not on file  Social History Narrative   Not on file   Social Determinants of Health   Financial Resource Strain: Not on file  Food Insecurity: Not on file  Transportation Needs: Not on file  Physical Activity: Not on file  Stress: Not on file  Social Connections: Not on file     Family History: The patient's family history includes Diabetes in her mother; Esophageal cancer in her father; Hypertension in her sister. There  is no history of Breast cancer. ROS:   Please see the history of present illness.    All other systems reviewed and are negative.  EKGs/Labs/Other Studies Reviewed:    The following studies were reviewed today:  EKG:  EKG ordered today and personally reviewed.  The ekg ordered today demonstrates  Recent Labs: 07/16/2021: BUN 18; Creatinine, Ser 0.98; Potassium 4.5; Sodium 141  Recent Lipid Panel  05/17/2021 total cholesterol 154 LDL 81 A1c 6.9% Physical Exam:    VS:  BP (!) 99/45   Pulse 84   Ht 5' 4.6" (1.641 m)   Wt 169 lb 3.2 oz (76.7 kg)   SpO2 95%   BMI 28.51 kg/m     Wt Readings from Last 3 Encounters:  03/03/22 169 lb 3.2 oz (76.7 kg)  09/02/21 168 lb (76.2 kg)  07/06/21 171 lb 6.4 oz (77.7 kg)     GEN:  Well nourished, well developed in no acute distress HEENT: Normal NECK: No JVD; No carotid bruits LYMPHATICS: No lymphadenopathy CARDIAC: RRR, no murmurs, rubs, gallops RESPIRATORY:  Clear to auscultation without rales, wheezing or rhonchi  ABDOMEN: Soft, non-tender, non-distended MUSCULOSKELETAL:  No edema; No deformity  SKIN: Warm and dry NEUROLOGIC:  Alert and oriented x 3 PSYCHIATRIC:  Normal affect    Signed, Shirlee More, MD  03/03/2022 10:34 AM    [place]

## 2022-03-10 DIAGNOSIS — H2703 Aphakia, bilateral: Secondary | ICD-10-CM | POA: Diagnosis not present

## 2022-03-10 DIAGNOSIS — E119 Type 2 diabetes mellitus without complications: Secondary | ICD-10-CM | POA: Diagnosis not present

## 2022-03-24 ENCOUNTER — Other Ambulatory Visit: Payer: Self-pay | Admitting: Cardiology

## 2022-05-04 DIAGNOSIS — R310 Gross hematuria: Secondary | ICD-10-CM | POA: Diagnosis not present

## 2022-05-04 DIAGNOSIS — N200 Calculus of kidney: Secondary | ICD-10-CM | POA: Diagnosis not present

## 2022-05-04 DIAGNOSIS — I878 Other specified disorders of veins: Secondary | ICD-10-CM | POA: Diagnosis not present

## 2022-05-04 DIAGNOSIS — N3946 Mixed incontinence: Secondary | ICD-10-CM | POA: Diagnosis not present

## 2022-05-10 DIAGNOSIS — R310 Gross hematuria: Secondary | ICD-10-CM | POA: Diagnosis not present

## 2022-05-10 DIAGNOSIS — N200 Calculus of kidney: Secondary | ICD-10-CM | POA: Diagnosis not present

## 2022-05-10 DIAGNOSIS — D259 Leiomyoma of uterus, unspecified: Secondary | ICD-10-CM | POA: Diagnosis not present

## 2022-06-01 ENCOUNTER — Telehealth: Payer: Self-pay | Admitting: Cardiology

## 2022-06-01 ENCOUNTER — Other Ambulatory Visit: Payer: Self-pay

## 2022-06-01 DIAGNOSIS — N200 Calculus of kidney: Secondary | ICD-10-CM | POA: Diagnosis not present

## 2022-06-01 DIAGNOSIS — N3946 Mixed incontinence: Secondary | ICD-10-CM | POA: Diagnosis not present

## 2022-06-01 DIAGNOSIS — R310 Gross hematuria: Secondary | ICD-10-CM | POA: Diagnosis not present

## 2022-06-01 MED ORDER — ISOSORBIDE MONONITRATE ER 30 MG PO TB24: 30.0000 mg | ORAL_TABLET | Freq: Every day | ORAL | 3 refills | Status: DC

## 2022-06-01 NOTE — Telephone Encounter (Signed)
Called patient and she reported that she was having pain in her right arm that travels up both sides of her neck and then down her left arm. The pain is so intense that she becomes SOB. These episodes started about 2 weeks ago and the pain comes and goes about 5 times over the past two weeks. Spoke to Dr. Agustin Cree regarding this patient's symptoms and he recommended starting her on Imdur 30 mg daily. Informed the patient of Dr. Wendy Poet recommendation and she verbalized understanding and had no further questions at this time.

## 2022-06-01 NOTE — Telephone Encounter (Signed)
Pt c/o of Chest Pain: STAT if CP now or developed within 24 hours  1. Are you having CP right now? Start in hert right arm travel up to both sides of her neck and than down to her left arm  2. Are you experiencing any other symptoms (ex. SOB, nausea, vomiting, sweating)? Pain so sharp at firs it takes her breath  3. How long have you been experiencing CP? about 2 weeks  4. Is your CP continuous or coming and going? comes and goes  5. Have you taken Nitroglycerin? yes ?- patient wanted to be seenJustin Mend her the first available appointment on 07-05-22

## 2022-06-02 NOTE — Telephone Encounter (Signed)
Pt states that she is not able to take Imdur due to it causing her to break out in a rash, itch, and cause redness. She wouldl like to know if anything else is able to be prescribed. Please advise

## 2022-06-02 NOTE — Telephone Encounter (Signed)
Pt c/o medication issue:  1. Name of Medication:   isosorbide mononitrate (IMDUR) 30 MG 24 hr tablet    2. How are you currently taking this medication (dosage and times per day)? Take 1 tablet (30 mg total) by mouth daily.   3. Are you having a reaction (difficulty breathing--STAT)? No  4. What is your medication issue? Pt states that she is not able to take medication due to it causing her to break out, itch, and cause redness. She wouldl ike to know if anything else is able to be prescribed. Please advise

## 2022-06-03 DIAGNOSIS — E782 Mixed hyperlipidemia: Secondary | ICD-10-CM | POA: Diagnosis not present

## 2022-06-03 DIAGNOSIS — E559 Vitamin D deficiency, unspecified: Secondary | ICD-10-CM | POA: Diagnosis not present

## 2022-06-03 DIAGNOSIS — J4520 Mild intermittent asthma, uncomplicated: Secondary | ICD-10-CM | POA: Diagnosis not present

## 2022-06-03 DIAGNOSIS — E785 Hyperlipidemia, unspecified: Secondary | ICD-10-CM | POA: Diagnosis not present

## 2022-06-03 DIAGNOSIS — I10 Essential (primary) hypertension: Secondary | ICD-10-CM | POA: Diagnosis not present

## 2022-06-03 DIAGNOSIS — Z9181 History of falling: Secondary | ICD-10-CM | POA: Diagnosis not present

## 2022-06-03 DIAGNOSIS — E1169 Type 2 diabetes mellitus with other specified complication: Secondary | ICD-10-CM | POA: Diagnosis not present

## 2022-06-03 DIAGNOSIS — Z1331 Encounter for screening for depression: Secondary | ICD-10-CM | POA: Diagnosis not present

## 2022-06-03 DIAGNOSIS — R609 Edema, unspecified: Secondary | ICD-10-CM | POA: Diagnosis not present

## 2022-06-03 DIAGNOSIS — K219 Gastro-esophageal reflux disease without esophagitis: Secondary | ICD-10-CM | POA: Diagnosis not present

## 2022-06-03 NOTE — Telephone Encounter (Signed)
Left message for the patient to call back.

## 2022-06-06 ENCOUNTER — Other Ambulatory Visit: Payer: Self-pay

## 2022-06-06 MED ORDER — RANOLAZINE ER 500 MG PO TB12: 500.0000 mg | ORAL_TABLET | Freq: Two times a day (BID) | ORAL | 3 refills | Status: DC

## 2022-06-06 NOTE — Telephone Encounter (Signed)
Called patient and informed her of Dr. Wendy Poet recommendation below:  "Stop Imdur start ranolazine 500 mg twice daily"  Patient was in agreement with this plan and had no further questions at this time.

## 2022-06-09 ENCOUNTER — Telehealth: Payer: Self-pay | Admitting: Cardiology

## 2022-06-09 NOTE — Telephone Encounter (Signed)
Recommendations reviewed with pt as per Karren Cobble, RPH's note.  Pt verbalized understanding and had no additional questions. Routed to PCP

## 2022-06-09 NOTE — Telephone Encounter (Signed)
Ranexa can theoretically increase exposure to metoprolol which could result in a slower heart rate and lower BP. Her metoprolol dose is very low so this is unlikely but she should be aware and monitor for symptoms as needed

## 2022-06-09 NOTE — Telephone Encounter (Signed)
Pt c/o medication issue:  1. Name of Medication: ranolazine (RANEXA) 500 MG 12 hr tablet   2. How are you currently taking this medication (dosage and times per day)?   Take 1 tablet (500 mg total) by mouth 2 (two) times daily.    3. Are you having a reaction (difficulty breathing--STAT)?   4. What is your medication issue? She wants to know if this medication is safe to take with her metoprolol tartrate.

## 2022-06-28 DIAGNOSIS — M25561 Pain in right knee: Secondary | ICD-10-CM | POA: Diagnosis not present

## 2022-06-28 DIAGNOSIS — M25511 Pain in right shoulder: Secondary | ICD-10-CM | POA: Diagnosis not present

## 2022-06-28 DIAGNOSIS — S80911A Unspecified superficial injury of right knee, initial encounter: Secondary | ICD-10-CM | POA: Diagnosis not present

## 2022-06-28 DIAGNOSIS — S4991XA Unspecified injury of right shoulder and upper arm, initial encounter: Secondary | ICD-10-CM | POA: Diagnosis not present

## 2022-06-28 DIAGNOSIS — S6991XA Unspecified injury of right wrist, hand and finger(s), initial encounter: Secondary | ICD-10-CM | POA: Diagnosis not present

## 2022-06-28 DIAGNOSIS — W19XXXA Unspecified fall, initial encounter: Secondary | ICD-10-CM | POA: Diagnosis not present

## 2022-06-29 DIAGNOSIS — S82034A Nondisplaced transverse fracture of right patella, initial encounter for closed fracture: Secondary | ICD-10-CM | POA: Diagnosis not present

## 2022-06-29 DIAGNOSIS — S52571A Other intraarticular fracture of lower end of right radius, initial encounter for closed fracture: Secondary | ICD-10-CM | POA: Diagnosis not present

## 2022-07-05 ENCOUNTER — Ambulatory Visit: Payer: Medicare Other | Admitting: Cardiology

## 2022-07-07 DIAGNOSIS — S52571A Other intraarticular fracture of lower end of right radius, initial encounter for closed fracture: Secondary | ICD-10-CM | POA: Diagnosis not present

## 2022-07-07 DIAGNOSIS — S82034A Nondisplaced transverse fracture of right patella, initial encounter for closed fracture: Secondary | ICD-10-CM | POA: Diagnosis not present

## 2022-07-27 DIAGNOSIS — S52571A Other intraarticular fracture of lower end of right radius, initial encounter for closed fracture: Secondary | ICD-10-CM | POA: Diagnosis not present

## 2022-07-27 DIAGNOSIS — S82034A Nondisplaced transverse fracture of right patella, initial encounter for closed fracture: Secondary | ICD-10-CM | POA: Diagnosis not present

## 2022-08-17 DIAGNOSIS — S52571A Other intraarticular fracture of lower end of right radius, initial encounter for closed fracture: Secondary | ICD-10-CM | POA: Diagnosis not present

## 2022-08-17 DIAGNOSIS — S82034A Nondisplaced transverse fracture of right patella, initial encounter for closed fracture: Secondary | ICD-10-CM | POA: Diagnosis not present

## 2022-08-24 DIAGNOSIS — L821 Other seborrheic keratosis: Secondary | ICD-10-CM | POA: Diagnosis not present

## 2022-08-24 DIAGNOSIS — L304 Erythema intertrigo: Secondary | ICD-10-CM | POA: Diagnosis not present

## 2022-09-07 DIAGNOSIS — M1712 Unilateral primary osteoarthritis, left knee: Secondary | ICD-10-CM | POA: Diagnosis not present

## 2022-09-13 DIAGNOSIS — N200 Calculus of kidney: Secondary | ICD-10-CM | POA: Diagnosis not present

## 2022-09-16 ENCOUNTER — Other Ambulatory Visit: Payer: Self-pay | Admitting: Cardiology

## 2022-09-19 DIAGNOSIS — S4991XA Unspecified injury of right shoulder and upper arm, initial encounter: Secondary | ICD-10-CM | POA: Diagnosis not present

## 2022-09-21 DIAGNOSIS — S52571A Other intraarticular fracture of lower end of right radius, initial encounter for closed fracture: Secondary | ICD-10-CM | POA: Diagnosis not present

## 2022-09-28 NOTE — Progress Notes (Signed)
Cardiology Office Note:    Date:  09/29/2022   ID:  Susan Gonzales, DOB Jun 18, 1950, MRN [id number]  PCP:  Hurshel Party, NP  Cardiologist:  Norman Herrlich, MD    Referring MD: Hurshel Party, NP    ASSESSMENT:    1. Coronary artery disease of native artery of native heart with stable angina pectoris (HCC)   2. Agatston coronary artery calcium score greater than 400   3. Hypertension, essential, benign   4. Mixed hyperlipidemia   5. Type 2 diabetes mellitus with hyperlipidemia (HCC)    PLAN:    In order of problems listed above:  Medically I think she is doing quite well with CAD I do not think her right upper extremity symptoms were anginal in nature at this time I continue her medical therapy including aspirin beta-blocker lipid-lowering to high intensity statin along with her calcium channel blocker and ranolazine. Stable hypertension BP at target continue current treatment Good result with high intensity statin continue rosuvastatin Managed by her PCP   Next appointment: 6 months   Medication Adjustments/Labs and Tests Ordered: Current medicines are reviewed at length with the patient today.  Concerns regarding medicines are outlined above.  Orders Placed This Encounter  Procedures   EKG 12-Lead   No orders of the defined types were placed in this encounter.    History of Present Illness:    Susan Gonzales is a 72 y.o. female with a hx of coronary artery disease with a severely elevated calcium score 760/94 percentile mild to moderate proximal LAD stenosis 50 to 60% and less than 50% left circumflex and right coronary artery July 2023  stenosis, hypertension hyperlipidemia type 2 diabetes last seen 03/03/2022.  Compliance with diet, lifestyle and medications: Yes  She had communicated to the office because she had an episode of nonexertional pain right upper extremity to the neck and was concerned regarding CAD She is now taking antianginal therapy with Ranexa She has had no  recurrence and is not having exertional chest pain shortness of breath palpitation or syncope Recent labs 06/03/2022 cholesterol 156 LDL 84 A1c 7.2 hemoglobin 40.9 creatinine 0.89 potassium 5.1 magnesium 1.6 TSH 0.695 BNP is quite low 6.3 Past Medical History:  Diagnosis Date   Acne 06/02/2021   Anemia    Asthma 06/02/2021   Diabetes mellitus (HCC) 06/02/2021   Fatty liver disease, nonalcoholic    Gastro-esophageal reflux disease without esophagitis    Hernia, diaphragmatic, without obstruction    History of hypertension 06/02/2021   Hypertension, essential, benign    Kidney stone 09/02/2021   Menopausal syndrome 06/02/2021   Mixed hyperlipidemia    Obesity 06/02/2021   Reduced libido 06/02/2021   Rosacea    Type 2 diabetes mellitus with hyperlipidemia (HCC) 06/02/2021    Current Medications: Current Meds  Medication Sig   albuterol (VENTOLIN HFA) 108 (90 Base) MCG/ACT inhaler Inhale 2 puffs into the lungs every 6 (six) hours as needed for wheezing or shortness of breath.   amLODipine (NORVASC) 5 MG tablet Take 5 mg by mouth daily.   aspirin EC 81 MG tablet Take 81 mg by mouth daily. Swallow whole.   benazepril (LOTENSIN) 20 MG tablet Take 20 mg by mouth daily.   benzonatate (TESSALON) 200 MG capsule Take 200 mg by mouth 3 (three) times daily as needed for cough.   Biotin 81191 MCG TABS Take 2 tablets by mouth daily.   celecoxib (CELEBREX) 200 MG capsule Take 200 mg by mouth in  the morning and at bedtime.   Cholecalciferol (VITAMIN D3) 50 MCG (2000 UT) TABS Take 1 tablet by mouth daily.   clotrimazole-betamethasone (LOTRISONE) cream Apply 1 application  topically as needed (rash).   Cyanocobalamin (B-12) 3000 MCG SUBL Place 1 tablet under the tongue daily.   ferrous sulfate 325 (65 FE) MG EC tablet Take 1 tablet by mouth daily.   fexofenadine (ALLEGRA) 180 MG tablet Take 180 mg by mouth daily.   fluticasone-salmeterol (ADVAIR) 250-50 MCG/ACT AEPB Inhale 1 puff into the lungs in  the morning and at bedtime.   furosemide (LASIX) 20 MG tablet Take 40 mg by mouth daily as needed for fluid or edema.   gelatin 650 MG capsule Take 1,300 mg by mouth 2 (two) times daily.   Magnesium 400 MG CAPS Take 1 tablet by mouth 2 (two) times daily.   metFORMIN (GLUCOPHAGE-XR) 500 MG 24 hr tablet Take 1,000 mg by mouth 2 (two) times daily with a meal.   metoprolol tartrate (LOPRESSOR) 25 MG tablet TAKE 1/2 TABLET(12.5 MG) BY MOUTH TWICE DAILY   Multiple Vitamins-Minerals (CENTRUM SILVER 50+WOMEN PO) Take 1 tablet by mouth daily.   nitroGLYCERIN (NITROSTAT) 0.4 MG SL tablet Place 1 tablet (0.4 mg total) under the tongue every 5 (five) minutes as needed for chest pain.   omeprazole (PRILOSEC) 40 MG capsule Take 40 mg by mouth daily.   ranolazine (RANEXA) 500 MG 12 hr tablet Take 1 tablet (500 mg total) by mouth 2 (two) times daily.   rosuvastatin (CRESTOR) 10 MG tablet Take 10 mg by mouth at bedtime.   sitaGLIPtin (JANUVIA) 100 MG tablet Take 100 mg by mouth daily.   tamsulosin (FLOMAX) 0.4 MG CAPS capsule Take 0.4 mg by mouth daily.      EKGs/Labs/Other Studies Reviewed:    The following studies were reviewed today:  Cardiac Studies & Procedures          CT SCANS  CT CORONARY MORPH W/CTA COR W/SCORE 07/26/2021  Addendum 07/26/2021  1:53 PM ADDENDUM REPORT: 07/26/2021 13:51  EXAM: OVER-READ INTERPRETATION  CT CHEST  The following report is an over-read performed by radiologist Dr. Kela Millin [facility] on 07/26/2021. This over-read does not include interpretation of cardiac or coronary anatomy or pathology. The coronary calcium score/coronary CTA interpretation by the cardiologist is attached.  COMPARISON:  CT chest dated July 06, 2006.  FINDINGS: Vascular: There are no significant vascular findings. Coronary and descending thoracic aorta atherosclerotic calcification.  Mediastinum/Nodes: There are no enlarged lymph nodes.The visualized esophagus  demonstrates no significant findings.  Lungs/Pleura: No focal consolidation, pleural effusion, or pneumothorax. Unchanged minimal scarring in the lingula and left lower lobe.  Upper abdomen: No acute abnormality.  Musculoskeletal/Chest wall: No chest wall abnormality. No acute or significant osseous findings.  IMPRESSION: 1. No significant extracardiac findings. 2. Aortic Atherosclerosis (ICD10-I70.0).   Electronically Signed By: Obie Dredge M.D. On: 07/26/2021 13:51  Narrative CLINICAL DATA:  Chest pain  EXAM: Cardiac CTA  MEDICATIONS: Sub lingual nitro. 4mg  and lopressor 100mg   TECHNIQUE: The patient was scanned on a Siemens Force 192 slice scanner. Gantry rotation speed was 250 msecs. Collimation was .6 mm. A 100 kV prospective scan was triggered in the ascending thoracic aorta at 140 HU's Full mA was used between 35% and 75% of the R-R interval. Average HR during the scan was 77 bpm. The 3D data set was interpreted on a dedicated work station using MPR, MIP and VRT modes. A total of 80 cc of  contrast was used.  FINDINGS: Non-cardiac: See separate report from [facility]. No significant findings on limited lung and soft tissue windows.  Calcium Score: 3 vessel coronary calcium noted  LM: 10  LAD: 328  RCA 293  LCX 132  Total: 764 which is 94 th percentile for age/sex  Coronary Arteries: Right dominant with no anomalies  LM: 1-24% calcified plaque  LAD: Long segment of calcified 50-69% calcified plaque proximally  D1: Not well visualized  D2: Normal  Circumflex: 1-24% calcified plaque proximally 25-49% calcified plaque mid vessel  OM1: Normal  AV Groove: Normal  RCA: 25-49% mixed plaque in proximal and mid vessel 1-24% mixed plaque in distal vessel  PDA: 1-24% calcified plaque  PLA: Normal  IMPRESSION: 1. Calcium score 764 which is 94 th percentile for age/sex  2.  Normal ascending thoracic aorta diameter 3.1 cm  3.  Poor quality study, relatively high heart rate poor opacification CAD RADS 3 most worrisome in proximal LAD If study rejected for FFR would consider  F/u PET/CT or cath depending on clinical suspicion  Charlton Haws  Electronically Signed: By: Charlton Haws M.D. On: 07/26/2021 13:01         EKG Interpretation Date/Time:  Thursday September 29 2022 13:46:04 EDT Ventricular Rate:  87 PR Interval:  164 QRS Duration:  74 QT Interval:  362 QTC Calculation: 435 R Axis:   41  Text Interpretation: Normal sinus rhythm Low voltage QRS Nonspecific T wave abnormality Confirmed by Norman Herrlich ([id number]) on 09/29/2022 1:51:57 PM      Physical Exam:    VS:  BP 116/60   Pulse 87   Ht 5\' 2"  (1.575 m)   Wt 158 lb 3.2 oz (71.8 kg)   SpO2 97%   BMI 28.94 kg/m     Wt Readings from Last 3 Encounters:  09/29/22 158 lb 3.2 oz (71.8 kg)  03/03/22 169 lb 3.2 oz (76.7 kg)  09/02/21 168 lb (76.2 kg)     GEN:  Well nourished, well developed in no acute distress HEENT: Normal NECK: No JVD; No carotid bruits LYMPHATICS: No lymphadenopathy CARDIAC: RRR, no murmurs, rubs, gallops RESPIRATORY:  Clear to auscultation without rales, wheezing or rhonchi  ABDOMEN: Soft, non-tender, non-distended MUSCULOSKELETAL:  No edema; No deformity  SKIN: Warm and dry NEUROLOGIC:  Alert and oriented x 3 PSYCHIATRIC:  Normal affect    Signed, Norman Herrlich, MD  09/29/2022 4:29 PM    [facility]

## 2022-09-29 ENCOUNTER — Ambulatory Visit: Payer: Medicare Other | Attending: Cardiology | Admitting: Cardiology

## 2022-09-29 ENCOUNTER — Encounter: Payer: Self-pay | Admitting: Cardiology

## 2022-09-29 VITALS — BP 116/60 | HR 87 | Ht 62.0 in | Wt 158.2 lb

## 2022-09-29 DIAGNOSIS — I25118 Atherosclerotic heart disease of native coronary artery with other forms of angina pectoris: Secondary | ICD-10-CM

## 2022-09-29 DIAGNOSIS — E1169 Type 2 diabetes mellitus with other specified complication: Secondary | ICD-10-CM | POA: Diagnosis not present

## 2022-09-29 DIAGNOSIS — M7581 Other shoulder lesions, right shoulder: Secondary | ICD-10-CM | POA: Diagnosis not present

## 2022-09-29 DIAGNOSIS — M19011 Primary osteoarthritis, right shoulder: Secondary | ICD-10-CM | POA: Diagnosis not present

## 2022-09-29 DIAGNOSIS — R931 Abnormal findings on diagnostic imaging of heart and coronary circulation: Secondary | ICD-10-CM | POA: Diagnosis not present

## 2022-09-29 DIAGNOSIS — M25511 Pain in right shoulder: Secondary | ICD-10-CM | POA: Diagnosis not present

## 2022-09-29 DIAGNOSIS — I10 Essential (primary) hypertension: Secondary | ICD-10-CM

## 2022-09-29 DIAGNOSIS — E785 Hyperlipidemia, unspecified: Secondary | ICD-10-CM | POA: Diagnosis not present

## 2022-09-29 DIAGNOSIS — E782 Mixed hyperlipidemia: Secondary | ICD-10-CM | POA: Diagnosis not present

## 2022-09-29 DIAGNOSIS — M75111 Incomplete rotator cuff tear or rupture of right shoulder, not specified as traumatic: Secondary | ICD-10-CM | POA: Diagnosis not present

## 2022-09-29 NOTE — Patient Instructions (Signed)

## 2022-10-07 DIAGNOSIS — R609 Edema, unspecified: Secondary | ICD-10-CM | POA: Diagnosis not present

## 2022-10-07 DIAGNOSIS — K760 Fatty (change of) liver, not elsewhere classified: Secondary | ICD-10-CM | POA: Diagnosis not present

## 2022-10-07 DIAGNOSIS — E782 Mixed hyperlipidemia: Secondary | ICD-10-CM | POA: Diagnosis not present

## 2022-10-07 DIAGNOSIS — I10 Essential (primary) hypertension: Secondary | ICD-10-CM | POA: Diagnosis not present

## 2022-10-07 DIAGNOSIS — D72829 Elevated white blood cell count, unspecified: Secondary | ICD-10-CM | POA: Diagnosis not present

## 2022-10-07 DIAGNOSIS — E1169 Type 2 diabetes mellitus with other specified complication: Secondary | ICD-10-CM | POA: Diagnosis not present

## 2022-10-07 DIAGNOSIS — J4520 Mild intermittent asthma, uncomplicated: Secondary | ICD-10-CM | POA: Diagnosis not present

## 2022-10-07 DIAGNOSIS — E785 Hyperlipidemia, unspecified: Secondary | ICD-10-CM | POA: Diagnosis not present

## 2022-10-07 DIAGNOSIS — K219 Gastro-esophageal reflux disease without esophagitis: Secondary | ICD-10-CM | POA: Diagnosis not present

## 2022-10-16 ENCOUNTER — Other Ambulatory Visit: Payer: Self-pay | Admitting: Cardiology

## 2022-10-17 DIAGNOSIS — S4991XA Unspecified injury of right shoulder and upper arm, initial encounter: Secondary | ICD-10-CM | POA: Diagnosis not present

## 2022-10-25 DIAGNOSIS — N200 Calculus of kidney: Secondary | ICD-10-CM | POA: Diagnosis not present

## 2022-10-25 DIAGNOSIS — I878 Other specified disorders of veins: Secondary | ICD-10-CM | POA: Diagnosis not present

## 2022-11-17 DIAGNOSIS — R0989 Other specified symptoms and signs involving the circulatory and respiratory systems: Secondary | ICD-10-CM | POA: Diagnosis not present

## 2022-11-17 DIAGNOSIS — R600 Localized edema: Secondary | ICD-10-CM | POA: Diagnosis not present

## 2022-11-17 DIAGNOSIS — R9431 Abnormal electrocardiogram [ECG] [EKG]: Secondary | ICD-10-CM | POA: Diagnosis not present

## 2022-11-17 DIAGNOSIS — Z79899 Other long term (current) drug therapy: Secondary | ICD-10-CM | POA: Diagnosis not present

## 2022-11-17 DIAGNOSIS — I498 Other specified cardiac arrhythmias: Secondary | ICD-10-CM | POA: Diagnosis not present

## 2022-11-17 DIAGNOSIS — L03116 Cellulitis of left lower limb: Secondary | ICD-10-CM | POA: Diagnosis not present

## 2022-11-28 DIAGNOSIS — Z6829 Body mass index (BMI) 29.0-29.9, adult: Secondary | ICD-10-CM | POA: Diagnosis not present

## 2022-11-28 DIAGNOSIS — L03115 Cellulitis of right lower limb: Secondary | ICD-10-CM | POA: Diagnosis not present

## 2022-11-28 DIAGNOSIS — R600 Localized edema: Secondary | ICD-10-CM | POA: Diagnosis not present

## 2022-11-28 DIAGNOSIS — L03116 Cellulitis of left lower limb: Secondary | ICD-10-CM | POA: Diagnosis not present

## 2022-11-29 NOTE — Progress Notes (Signed)
Left voicemail for patient to call us back and to stop taking aspirin , celebrex and all nonsteroidal anti inflammatories today.

## 2022-11-30 ENCOUNTER — Encounter (HOSPITAL_BASED_OUTPATIENT_CLINIC_OR_DEPARTMENT_OTHER): Payer: Self-pay | Admitting: Urology

## 2022-11-30 NOTE — Progress Notes (Signed)
Preop litho call completed. Verbalized understanding of new arrival time 1130. Nothing to eat after MN, clear liqiuds until 6 AM. May take blood pressure and reflux meds. To wear comfortabe clothes without buckles or metal buttons, zippers. No sandles, flip flops, or crocs/clogs.

## 2022-12-02 ENCOUNTER — Ambulatory Visit (HOSPITAL_BASED_OUTPATIENT_CLINIC_OR_DEPARTMENT_OTHER)
Admission: RE | Admit: 2022-12-02 | Discharge: 2022-12-02 | Disposition: A | Discharge status: Home / Self Care | Payer: Medicare Other | Attending: Urology | Admitting: Urology

## 2022-12-02 ENCOUNTER — Encounter (HOSPITAL_BASED_OUTPATIENT_CLINIC_OR_DEPARTMENT_OTHER)
Admission: RE | Disposition: A | Discharge status: Home / Self Care | Payer: Self-pay | Source: Home / Self Care | Attending: Urology

## 2022-12-02 ENCOUNTER — Ambulatory Visit (HOSPITAL_COMMUNITY): Payer: Medicare Other

## 2022-12-02 ENCOUNTER — Encounter (HOSPITAL_BASED_OUTPATIENT_CLINIC_OR_DEPARTMENT_OTHER): Payer: Self-pay | Admitting: Urology

## 2022-12-02 DIAGNOSIS — N200 Calculus of kidney: Secondary | ICD-10-CM | POA: Diagnosis not present

## 2022-12-02 DIAGNOSIS — E119 Type 2 diabetes mellitus without complications: Secondary | ICD-10-CM | POA: Diagnosis not present

## 2022-12-02 DIAGNOSIS — K219 Gastro-esophageal reflux disease without esophagitis: Secondary | ICD-10-CM | POA: Diagnosis not present

## 2022-12-02 DIAGNOSIS — Z7984 Long term (current) use of oral hypoglycemic drugs: Secondary | ICD-10-CM | POA: Diagnosis not present

## 2022-12-02 DIAGNOSIS — I10 Essential (primary) hypertension: Secondary | ICD-10-CM | POA: Insufficient documentation

## 2022-12-02 HISTORY — PX: EXTRACORPOREAL SHOCK WAVE LITHOTRIPSY: SHX1557

## 2022-12-02 LAB — GLUCOSE, CAPILLARY: Glucose-Capillary: 116 mg/dL — ABNORMAL HIGH (ref 70–99)

## 2022-12-02 SURGERY — LITHOTRIPSY, ESWL
Anesthesia: LOCAL | Laterality: Right

## 2022-12-02 MED ORDER — DIPHENHYDRAMINE HCL 25 MG PO CAPS
ORAL_CAPSULE | ORAL | Status: AC
  Filled 2022-12-02: qty 1

## 2022-12-02 MED ORDER — SODIUM CHLORIDE 0.9 % IV SOLN: INTRAVENOUS | Status: DC

## 2022-12-02 MED ORDER — DIAZEPAM 5 MG PO TABS
10.0000 mg | ORAL_TABLET | ORAL | Status: AC
  Administered 2022-12-02: 10 mg via ORAL

## 2022-12-02 MED ORDER — LEVOFLOXACIN IN D5W 500 MG/100ML IV SOLN
500.0000 mg | INTRAVENOUS | Status: AC
  Administered 2022-12-02: 500 mg via INTRAVENOUS

## 2022-12-02 MED ORDER — DIAZEPAM 5 MG PO TABS
ORAL_TABLET | ORAL | Status: AC
  Filled 2022-12-02: qty 2

## 2022-12-02 MED ORDER — DIPHENHYDRAMINE HCL 25 MG PO CAPS
25.0000 mg | ORAL_CAPSULE | ORAL | Status: AC
  Administered 2022-12-02: 25 mg via ORAL

## 2022-12-02 MED ORDER — LEVOFLOXACIN IN D5W 500 MG/100ML IV SOLN
INTRAVENOUS | Status: AC
  Filled 2022-12-02: qty 100

## 2022-12-02 NOTE — Progress Notes (Signed)
Spoke with Dr. Saddie Benders about reconciling patient's medications. He stated it will be a couple of hours before he is able to do it and stated that she needs to hold her aspirin and may resume it on Monday if her urine is clear. Also he did not anticipate any other changes to the medication list that was provided pre operatively. He sent prescriptions for levaquin and oxycodone to the [facility] on [address].  Instructions provided to patient and her sonJudie Grieve in regards to all of this.

## 2022-12-05 ENCOUNTER — Encounter (HOSPITAL_BASED_OUTPATIENT_CLINIC_OR_DEPARTMENT_OTHER): Payer: Self-pay | Admitting: Urology

## 2022-12-06 DIAGNOSIS — M1812 Unilateral primary osteoarthritis of first carpometacarpal joint, left hand: Secondary | ICD-10-CM | POA: Diagnosis not present

## 2022-12-09 ENCOUNTER — Telehealth: Payer: Self-pay

## 2022-12-09 NOTE — Telephone Encounter (Signed)
Transition Care Management Unsuccessful Follow-up Telephone Call  Date of discharge and from where:  11/17/2022 [facility]  Attempts:  1st Attempt  Reason for unsuccessful TCM follow-up call:  Left voice message  Kateena Degroote [facility]  [facility] Resource Care Guide Direct Dial: [phone]  Website: [email or web address]

## 2022-12-12 ENCOUNTER — Telehealth: Payer: Self-pay

## 2022-12-12 NOTE — Telephone Encounter (Signed)
Transition Care Management Unsuccessful Follow-up Telephone Call  Date of discharge and from where:  11/17/2022 [facility]  Attempts:  2nd Attempt  Reason for unsuccessful TCM follow-up call:  Left voice message  Charmeka Freeburg [facility]  [facility], [facility] Resource Care Guide Direct Dial: [phone]  Website: [email or web address]

## 2022-12-15 NOTE — H&P (Signed)
NAME: Susan Gonzales, 71 F. MEDICAL RECORD NO: [id number] ACCOUNT NO: [id number] DATE OF BIRTH: [birth year] FACILITY: [facility] LOCATION: [facility] PHYSICIAN: Debroah Baller, MD  History and Physical   DATE OF ADMISSION: 12/02/2022  HISTORY OF PRESENT ILLNESS:  The patient is a 72 year old white female with a history of renal lithiasis.  She has been having intermittent right flank pain and hematuria and now presents for definitive therapy.  She is scheduled for extracorporeal shock  wave lithotripsy on the right.  PAST MEDICAL HISTORY:  Significant for arthritis, diabetes mellitus, gastroesophageal reflux disease, hypertension, elevated cholesterol.  MEDICATIONS:  Include Advair Diskus, Allegra Allergy 180 mg, amlodipine 5 mg every day, benazepril, hydrochloride 20 mg every day.  Celecoxib 200 mg 2 times daily, which she has held, furosemide 20 mg 2 q.a.m., Januvia 100 mg tablet every day, metformin  500 mg tablet 2 tabs b.i.d., metoprolol tartrate 25 mg 1/2 tab orally 2 times daily, rosuvastatin 10 mg every day.  FAMILY HISTORY:  Shows her mother had diabetes, hypertension and had a CVA.  SOCIAL HISTORY:  She does not smoke.  Her marital status is widowed.  She lives by herself.  She does not consume alcohol.  PAST SURGICAL HISTORY:  Include rotator cuff repair on the right, carpal tunnel surgery, both hands and tubal ligation.  REVIEW OF SYSTEMS:  Generally, she is a well-appearing lady.  She denies any history of chest pain.  No significant shortness of breath.  Her bowel movements have been regular.  No irregular vaginal bleeding, normal speech.  PHYSICAL EXAMINATION: GENERAL:  A well-developed, well-nourished lady in no acute distress.   HEENT:  Head is normocephalic and atraumatic.  Eyes show that pupils are reactive to light. NECK:  Without masses. LUNGS:  Clear to auscultation. BACK:  Without CVA tenderness. HEART:  Sounds show regular rate and rhythm. ABDOMEN:  Shows mildly obese.  No  tenderness.  No palpable masses. NEUROLOGIC:  She is nonfocal. EXTREMITIES:  Her lower extremities show minimal edema, no tenderness.  LABORATORY DATA:  Pertinent laboratories are pending.  Recent KUB shows stones in the right kidney, in the renal pelvis.  The largest stone being 13 mm.  There is also a 7 mm and 5 mm stone noted.  ASSESSMENT:  Right renal lithiasis.  PLAN:  The patient has discussed the risks and benefits of right ESWL.  Questions have been answered and she verbalizes understanding.  She wishes to proceed with the treatment.   SHY D: 12/15/2022 12:10:26 pm T: 12/15/2022 12:53:00 pm  JOB: [id number]/ [id number]

## 2023-01-05 DIAGNOSIS — Z23 Encounter for immunization: Secondary | ICD-10-CM | POA: Diagnosis not present

## 2023-01-25 DIAGNOSIS — J3489 Other specified disorders of nose and nasal sinuses: Secondary | ICD-10-CM | POA: Diagnosis not present

## 2023-01-25 DIAGNOSIS — J209 Acute bronchitis, unspecified: Secondary | ICD-10-CM | POA: Diagnosis not present

## 2023-01-25 DIAGNOSIS — R051 Acute cough: Secondary | ICD-10-CM | POA: Diagnosis not present

## 2023-01-25 DIAGNOSIS — R0981 Nasal congestion: Secondary | ICD-10-CM | POA: Diagnosis not present

## 2023-02-06 DIAGNOSIS — E559 Vitamin D deficiency, unspecified: Secondary | ICD-10-CM | POA: Diagnosis not present

## 2023-02-06 DIAGNOSIS — D72829 Elevated white blood cell count, unspecified: Secondary | ICD-10-CM | POA: Diagnosis not present

## 2023-02-06 DIAGNOSIS — E785 Hyperlipidemia, unspecified: Secondary | ICD-10-CM | POA: Diagnosis not present

## 2023-02-06 DIAGNOSIS — J4520 Mild intermittent asthma, uncomplicated: Secondary | ICD-10-CM | POA: Diagnosis not present

## 2023-02-06 DIAGNOSIS — E782 Mixed hyperlipidemia: Secondary | ICD-10-CM | POA: Diagnosis not present

## 2023-02-06 DIAGNOSIS — E1169 Type 2 diabetes mellitus with other specified complication: Secondary | ICD-10-CM | POA: Diagnosis not present

## 2023-02-06 DIAGNOSIS — Z6827 Body mass index (BMI) 27.0-27.9, adult: Secondary | ICD-10-CM | POA: Diagnosis not present

## 2023-02-06 DIAGNOSIS — R600 Localized edema: Secondary | ICD-10-CM | POA: Diagnosis not present

## 2023-02-06 DIAGNOSIS — K219 Gastro-esophageal reflux disease without esophagitis: Secondary | ICD-10-CM | POA: Diagnosis not present

## 2023-02-06 DIAGNOSIS — I10 Essential (primary) hypertension: Secondary | ICD-10-CM | POA: Diagnosis not present

## 2023-06-08 DIAGNOSIS — E559 Vitamin D deficiency, unspecified: Secondary | ICD-10-CM | POA: Diagnosis not present

## 2023-06-08 DIAGNOSIS — Z9181 History of falling: Secondary | ICD-10-CM | POA: Diagnosis not present

## 2023-06-08 DIAGNOSIS — E1169 Type 2 diabetes mellitus with other specified complication: Secondary | ICD-10-CM | POA: Diagnosis not present

## 2023-06-08 DIAGNOSIS — D72829 Elevated white blood cell count, unspecified: Secondary | ICD-10-CM | POA: Diagnosis not present

## 2023-06-08 DIAGNOSIS — E785 Hyperlipidemia, unspecified: Secondary | ICD-10-CM | POA: Diagnosis not present

## 2023-06-08 DIAGNOSIS — E782 Mixed hyperlipidemia: Secondary | ICD-10-CM | POA: Diagnosis not present

## 2023-06-08 DIAGNOSIS — I10 Essential (primary) hypertension: Secondary | ICD-10-CM | POA: Diagnosis not present

## 2023-06-08 DIAGNOSIS — R600 Localized edema: Secondary | ICD-10-CM | POA: Diagnosis not present

## 2023-06-08 DIAGNOSIS — M8580 Other specified disorders of bone density and structure, unspecified site: Secondary | ICD-10-CM | POA: Diagnosis not present

## 2023-06-08 DIAGNOSIS — J4520 Mild intermittent asthma, uncomplicated: Secondary | ICD-10-CM | POA: Diagnosis not present

## 2023-06-08 DIAGNOSIS — K219 Gastro-esophageal reflux disease without esophagitis: Secondary | ICD-10-CM | POA: Diagnosis not present

## 2023-06-08 DIAGNOSIS — K760 Fatty (change of) liver, not elsewhere classified: Secondary | ICD-10-CM | POA: Diagnosis not present

## 2023-06-26 DIAGNOSIS — L03116 Cellulitis of left lower limb: Secondary | ICD-10-CM | POA: Diagnosis not present

## 2023-06-26 DIAGNOSIS — S80922A Unspecified superficial injury of left lower leg, initial encounter: Secondary | ICD-10-CM | POA: Diagnosis not present

## 2023-06-26 DIAGNOSIS — M8589 Other specified disorders of bone density and structure, multiple sites: Secondary | ICD-10-CM | POA: Diagnosis not present

## 2023-07-10 DIAGNOSIS — L03115 Cellulitis of right lower limb: Secondary | ICD-10-CM | POA: Diagnosis not present

## 2023-07-10 DIAGNOSIS — L03116 Cellulitis of left lower limb: Secondary | ICD-10-CM | POA: Diagnosis not present

## 2023-07-15 ENCOUNTER — Other Ambulatory Visit: Payer: Self-pay | Admitting: Cardiology

## 2023-08-09 DIAGNOSIS — N200 Calculus of kidney: Secondary | ICD-10-CM | POA: Diagnosis not present

## 2023-08-22 DIAGNOSIS — Z87442 Personal history of urinary calculi: Secondary | ICD-10-CM | POA: Diagnosis not present

## 2023-08-22 DIAGNOSIS — N200 Calculus of kidney: Secondary | ICD-10-CM | POA: Diagnosis not present

## 2023-09-06 DIAGNOSIS — Z1231 Encounter for screening mammogram for malignant neoplasm of breast: Secondary | ICD-10-CM | POA: Diagnosis not present

## 2023-09-06 DIAGNOSIS — M810 Age-related osteoporosis without current pathological fracture: Secondary | ICD-10-CM | POA: Diagnosis not present

## 2023-09-07 DIAGNOSIS — M810 Age-related osteoporosis without current pathological fracture: Secondary | ICD-10-CM | POA: Diagnosis not present

## 2023-09-07 DIAGNOSIS — Z780 Asymptomatic menopausal state: Secondary | ICD-10-CM | POA: Diagnosis not present

## 2023-09-16 ENCOUNTER — Other Ambulatory Visit: Payer: Self-pay | Admitting: Cardiology

## 2023-09-19 DIAGNOSIS — M1712 Unilateral primary osteoarthritis, left knee: Secondary | ICD-10-CM | POA: Diagnosis not present

## 2023-10-06 DIAGNOSIS — M1990 Unspecified osteoarthritis, unspecified site: Secondary | ICD-10-CM | POA: Diagnosis not present

## 2023-10-06 DIAGNOSIS — E785 Hyperlipidemia, unspecified: Secondary | ICD-10-CM | POA: Diagnosis not present

## 2023-10-06 DIAGNOSIS — Z6827 Body mass index (BMI) 27.0-27.9, adult: Secondary | ICD-10-CM | POA: Diagnosis not present

## 2023-10-06 DIAGNOSIS — K219 Gastro-esophageal reflux disease without esophagitis: Secondary | ICD-10-CM | POA: Diagnosis not present

## 2023-10-06 DIAGNOSIS — E782 Mixed hyperlipidemia: Secondary | ICD-10-CM | POA: Diagnosis not present

## 2023-10-06 DIAGNOSIS — J4520 Mild intermittent asthma, uncomplicated: Secondary | ICD-10-CM | POA: Diagnosis not present

## 2023-10-06 DIAGNOSIS — K760 Fatty (change of) liver, not elsewhere classified: Secondary | ICD-10-CM | POA: Diagnosis not present

## 2023-10-06 DIAGNOSIS — R600 Localized edema: Secondary | ICD-10-CM | POA: Diagnosis not present

## 2023-10-06 DIAGNOSIS — D72829 Elevated white blood cell count, unspecified: Secondary | ICD-10-CM | POA: Diagnosis not present

## 2023-10-06 DIAGNOSIS — E1169 Type 2 diabetes mellitus with other specified complication: Secondary | ICD-10-CM | POA: Diagnosis not present

## 2023-10-06 DIAGNOSIS — I10 Essential (primary) hypertension: Secondary | ICD-10-CM | POA: Diagnosis not present

## 2023-10-17 DIAGNOSIS — M545 Low back pain, unspecified: Secondary | ICD-10-CM | POA: Diagnosis not present

## 2023-10-17 DIAGNOSIS — M4316 Spondylolisthesis, lumbar region: Secondary | ICD-10-CM | POA: Diagnosis not present

## 2023-10-18 ENCOUNTER — Other Ambulatory Visit: Payer: Self-pay | Admitting: Physician Assistant

## 2023-10-18 DIAGNOSIS — M545 Low back pain, unspecified: Secondary | ICD-10-CM

## 2023-10-25 NOTE — Progress Notes (Signed)
 Cardiology Office Note:    Date:  10/26/2023   ID:  Susan Gonzales, DOB 09-27-1950, MRN [id number]  PCP:  Susan Greig LABOR, NP  Cardiologist:  Redell Leiter, MD    Referring MD: Susan Greig LABOR, NP    ASSESSMENT:    1. Coronary artery disease of native artery of native heart with stable angina pectoris (HCC)   2. Agatston coronary artery calcium  score greater than 400   3. Hypertension, essential, benign   4. Mixed hyperlipidemia   5. Type 2 diabetes mellitus with hyperlipidemia (HCC)    PLAN:    In order of problems listed above:  Stable CAD she assures me she will get back on her statin and continue her aspirin beta-blocker rate limiting calcium  channel blocker and ranolazine .  At this time I would not repeat an ischemia evaluation Restart her lipid-lowering therapy Well-controlled continue her current treatment including loop diuretic beta-blocker calcium  channel blocker Stable diabetes managed with her PCP last A1c 6.7% on metformin   Next appointment: 1 year and follow-up   Medication Adjustments/Labs and Tests Ordered: Current medicines are reviewed at length with the patient today.  Concerns regarding medicines are outlined above.  No orders of the defined types were placed in this encounter.  No orders of the defined types were placed in this encounter.    History of Present Illness:    Susan Gonzales is a 73 y.o. female with a hx of Neri artery disease with severely elevated coronary calcium  score of 760/95 thpercentile mild to moderate proximal LAD stenosis 50 to 60% and less than 50% left circumflex and right coronary stenosis hypertension hyperlipidemia and type 2 diabetes last seen 09/29/2022.  Compliance with diet, lifestyle and medications: Yes  I approached her about her lipid profile and she acknowledges she has not been taking rosuvastatin  she is not intolerant we will restart at 20 mg daily She had a difficult time in her life recently she had been widowed several  years ago and recently her mother-in-law She has had no recent anginal discomfort edema shortness of breath palp stations or syncope Past Medical History:  Diagnosis Date   Acne 06/02/2021   Anemia    Asthma 06/02/2021   Diabetes mellitus (HCC) 06/02/2021   Fatty liver disease, nonalcoholic    Gastro-esophageal reflux disease without esophagitis    Hernia, diaphragmatic, without obstruction    History of hypertension 06/02/2021   Hypertension, essential, benign    Kidney stone 09/02/2021   Menopausal syndrome 06/02/2021   Mixed hyperlipidemia    Obesity 06/02/2021   Reduced libido 06/02/2021   Rosacea    Type 2 diabetes mellitus with hyperlipidemia (HCC) 06/02/2021    Current Medications: No outpatient medications have been marked as taking for the 10/26/23 encounter (Appointment) with Leiter Redell PARAS, MD.      EKGs/Labs/Other Studies Reviewed:    The following studies were reviewed today:      EKG Interpretation Date/Time:  Thursday October 26 2023 10:54:50 EDT Ventricular Rate:  82 PR Interval:  178 QRS Duration:  80 QT Interval:  362 QTC Calculation: 422 R Axis:   19  Text Interpretation: Normal sinus rhythm Normal ECG When compared with ECG of 29-Sep-2022 13:46, No significant change was found Confirmed by Leiter Redell ([id number]) on 10/26/2023 10:58:34 AM   Recent Labs: 04/08/2023 cholesterol 246 LDL 79 HDL cholesterol 191 hemoglobin 13.9 creatinine 1.48 potassium 4.7  Physical Exam:    VS:  There were no vitals taken for this visit.  Wt Readings from Last 3 Encounters:  12/02/22 155 lb 12.8 oz (70.7 kg)  09/29/22 158 lb 3.2 oz (71.8 kg)  03/03/22 169 lb 3.2 oz (76.7 kg)     GEN:  Well nourished, well developed in no acute distress HEENT: Normal NECK: No JVD; No carotid bruits LYMPHATICS: No lymphadenopathy CARDIAC: RRR, no murmurs, rubs, gallops RESPIRATORY:  Clear to auscultation without rales, wheezing or rhonchi  ABDOMEN: Soft, non-tender,  non-distended MUSCULOSKELETAL:  No edema; No deformity  SKIN: Warm and dry NEUROLOGIC:  Alert and oriented x 3 PSYCHIATRIC:  Normal affect    Signed, Redell Leiter, MD  10/26/2023 7:54 AM    [facility]

## 2023-10-26 ENCOUNTER — Ambulatory Visit: Attending: Cardiology | Admitting: Cardiology

## 2023-10-26 ENCOUNTER — Encounter: Payer: Self-pay | Admitting: Cardiology

## 2023-10-26 VITALS — BP 130/64 | HR 82 | Ht 62.0 in | Wt 157.0 lb

## 2023-10-26 DIAGNOSIS — R931 Abnormal findings on diagnostic imaging of heart and coronary circulation: Secondary | ICD-10-CM | POA: Diagnosis not present

## 2023-10-26 DIAGNOSIS — I25118 Atherosclerotic heart disease of native coronary artery with other forms of angina pectoris: Secondary | ICD-10-CM | POA: Diagnosis not present

## 2023-10-26 DIAGNOSIS — I10 Essential (primary) hypertension: Secondary | ICD-10-CM | POA: Diagnosis not present

## 2023-10-26 DIAGNOSIS — E1169 Type 2 diabetes mellitus with other specified complication: Secondary | ICD-10-CM | POA: Diagnosis not present

## 2023-10-26 DIAGNOSIS — E782 Mixed hyperlipidemia: Secondary | ICD-10-CM | POA: Diagnosis not present

## 2023-10-26 DIAGNOSIS — E785 Hyperlipidemia, unspecified: Secondary | ICD-10-CM | POA: Diagnosis not present

## 2023-10-26 MED ORDER — ROSUVASTATIN CALCIUM 20 MG PO TABS: 20.0000 mg | ORAL_TABLET | Freq: Every day | ORAL | 3 refills | Status: AC

## 2023-10-26 NOTE — Patient Instructions (Signed)
 Medication Instructions:  Your physician has recommended you make the following change in your medication:   START: Rosuvastatin  20 mg daily  *If you need a refill on your cardiac medications before your next appointment, please call your pharmacy*  Lab Work: None If you have labs (blood work) drawn today and your tests are completely normal, you will receive your results only by: MyChart Message (if you have MyChart) OR A paper copy in the mail If you have any lab test that is abnormal or we need to change your treatment, we will call you to review the results.  Testing/Procedures: None  Follow-Up: At [facility], you and your health needs are our priority.  As part of our continuing mission to provide you with exceptional heart care, our providers are all part of one team.  This team includes your primary Cardiologist (physician) and Advanced Practice Providers or APPs (Physician Assistants and Nurse Practitioners) who all work together to provide you with the care you need, when you need it.  Your next appointment:   1 year(s)  Provider:   Redell Leiter, MD    We recommend signing up for the patient portal called MyChart.  Sign up information is provided on this After Visit Summary.  MyChart is used to connect with patients for Virtual Visits (Telemedicine).  Patients are able to view lab/test results, encounter notes, upcoming appointments, etc.  Non-urgent messages can be sent to your provider as well.   To learn more about what you can do with MyChart, go to [email or web address].   Other Instructions None

## 2023-11-01 NOTE — Discharge Instructions (Signed)

## 2023-11-02 ENCOUNTER — Inpatient Hospital Stay
Admission: RE | Admit: 2023-11-02 | Discharge: 2023-11-02 | Disposition: A | Discharge status: Home / Self Care | Source: Ambulatory Visit | Attending: Physician Assistant | Admitting: Physician Assistant

## 2023-11-02 DIAGNOSIS — M545 Low back pain, unspecified: Secondary | ICD-10-CM

## 2023-11-02 DIAGNOSIS — M48061 Spinal stenosis, lumbar region without neurogenic claudication: Secondary | ICD-10-CM | POA: Diagnosis not present

## 2023-11-02 DIAGNOSIS — M47817 Spondylosis without myelopathy or radiculopathy, lumbosacral region: Secondary | ICD-10-CM | POA: Diagnosis not present

## 2023-11-02 MED ORDER — IOPAMIDOL (ISOVUE-M 200) INJECTION 41%
1.0000 mL | Freq: Once | INTRAMUSCULAR | Status: AC
  Administered 2023-11-02: 1 mL via EPIDURAL

## 2023-11-02 MED ORDER — METHYLPREDNISOLONE ACETATE 40 MG/ML INJ SUSP (RADIOLOG
80.0000 mg | Freq: Once | INTRAMUSCULAR | Status: AC
  Administered 2023-11-02: 80 mg via EPIDURAL

## 2023-11-21 DIAGNOSIS — H18833 Recurrent erosion of cornea, bilateral: Secondary | ICD-10-CM | POA: Diagnosis not present

## 2023-11-21 DIAGNOSIS — H26493 Other secondary cataract, bilateral: Secondary | ICD-10-CM | POA: Diagnosis not present

## 2023-11-21 DIAGNOSIS — E119 Type 2 diabetes mellitus without complications: Secondary | ICD-10-CM | POA: Diagnosis not present

## 2023-12-14 DIAGNOSIS — M25561 Pain in right knee: Secondary | ICD-10-CM | POA: Diagnosis not present

## 2023-12-14 DIAGNOSIS — R262 Difficulty in walking, not elsewhere classified: Secondary | ICD-10-CM | POA: Diagnosis not present

## 2023-12-14 DIAGNOSIS — M170 Bilateral primary osteoarthritis of knee: Secondary | ICD-10-CM | POA: Diagnosis not present

## 2023-12-14 DIAGNOSIS — M25562 Pain in left knee: Secondary | ICD-10-CM | POA: Diagnosis not present

## 2023-12-18 DIAGNOSIS — R059 Cough, unspecified: Secondary | ICD-10-CM | POA: Diagnosis not present

## 2023-12-18 DIAGNOSIS — R0981 Nasal congestion: Secondary | ICD-10-CM | POA: Diagnosis not present

## 2023-12-18 DIAGNOSIS — R509 Fever, unspecified: Secondary | ICD-10-CM | POA: Diagnosis not present

## 2023-12-20 ENCOUNTER — Other Ambulatory Visit: Payer: Self-pay | Admitting: Cardiology

## 2023-12-28 DIAGNOSIS — M25561 Pain in right knee: Secondary | ICD-10-CM | POA: Diagnosis not present

## 2023-12-28 DIAGNOSIS — M170 Bilateral primary osteoarthritis of knee: Secondary | ICD-10-CM | POA: Diagnosis not present

## 2023-12-28 DIAGNOSIS — R262 Difficulty in walking, not elsewhere classified: Secondary | ICD-10-CM | POA: Diagnosis not present

## 2023-12-28 DIAGNOSIS — M25562 Pain in left knee: Secondary | ICD-10-CM | POA: Diagnosis not present

## 2024-01-04 DIAGNOSIS — R262 Difficulty in walking, not elsewhere classified: Secondary | ICD-10-CM | POA: Diagnosis not present

## 2024-01-04 DIAGNOSIS — M170 Bilateral primary osteoarthritis of knee: Secondary | ICD-10-CM | POA: Diagnosis not present

## 2024-01-04 DIAGNOSIS — M25561 Pain in right knee: Secondary | ICD-10-CM | POA: Diagnosis not present

## 2024-01-04 DIAGNOSIS — M25562 Pain in left knee: Secondary | ICD-10-CM | POA: Diagnosis not present

## 2024-01-16 DIAGNOSIS — R262 Difficulty in walking, not elsewhere classified: Secondary | ICD-10-CM | POA: Diagnosis not present

## 2024-01-16 DIAGNOSIS — M170 Bilateral primary osteoarthritis of knee: Secondary | ICD-10-CM | POA: Diagnosis not present

## 2024-01-16 DIAGNOSIS — M25561 Pain in right knee: Secondary | ICD-10-CM | POA: Diagnosis not present

## 2024-01-16 DIAGNOSIS — M25562 Pain in left knee: Secondary | ICD-10-CM | POA: Diagnosis not present

## 2024-01-23 DIAGNOSIS — R262 Difficulty in walking, not elsewhere classified: Secondary | ICD-10-CM | POA: Diagnosis not present

## 2024-01-23 DIAGNOSIS — M170 Bilateral primary osteoarthritis of knee: Secondary | ICD-10-CM | POA: Diagnosis not present

## 2024-01-23 DIAGNOSIS — M25561 Pain in right knee: Secondary | ICD-10-CM | POA: Diagnosis not present

## 2024-01-23 DIAGNOSIS — M25562 Pain in left knee: Secondary | ICD-10-CM | POA: Diagnosis not present

## 2024-02-06 DIAGNOSIS — K760 Fatty (change of) liver, not elsewhere classified: Secondary | ICD-10-CM | POA: Diagnosis not present

## 2024-02-06 DIAGNOSIS — I10 Essential (primary) hypertension: Secondary | ICD-10-CM | POA: Diagnosis not present

## 2024-02-06 DIAGNOSIS — D72829 Elevated white blood cell count, unspecified: Secondary | ICD-10-CM | POA: Diagnosis not present

## 2024-02-06 DIAGNOSIS — E782 Mixed hyperlipidemia: Secondary | ICD-10-CM | POA: Diagnosis not present

## 2024-02-06 DIAGNOSIS — E538 Deficiency of other specified B group vitamins: Secondary | ICD-10-CM | POA: Diagnosis not present

## 2024-02-06 DIAGNOSIS — E1169 Type 2 diabetes mellitus with other specified complication: Secondary | ICD-10-CM | POA: Diagnosis not present

## 2024-02-06 DIAGNOSIS — K219 Gastro-esophageal reflux disease without esophagitis: Secondary | ICD-10-CM | POA: Diagnosis not present

## 2024-02-06 DIAGNOSIS — M1990 Unspecified osteoarthritis, unspecified site: Secondary | ICD-10-CM | POA: Diagnosis not present

## 2024-02-06 DIAGNOSIS — Z23 Encounter for immunization: Secondary | ICD-10-CM | POA: Diagnosis not present

## 2024-02-06 DIAGNOSIS — E785 Hyperlipidemia, unspecified: Secondary | ICD-10-CM | POA: Diagnosis not present

## 2024-02-06 DIAGNOSIS — R600 Localized edema: Secondary | ICD-10-CM | POA: Diagnosis not present

## 2024-02-06 DIAGNOSIS — J4520 Mild intermittent asthma, uncomplicated: Secondary | ICD-10-CM | POA: Diagnosis not present
